# Patient Record
Sex: Female | Born: 1995 | Race: White | Hispanic: Yes | Marital: Single | Smoking: Former smoker
Health system: Southern US, Community
[De-identification: ages and names within clinical notes are randomized; demographics above are authoritative.]

## PROBLEM LIST (undated history)

## (undated) DIAGNOSIS — G43909 Migraine, unspecified, not intractable, without status migrainosus: Secondary | ICD-10-CM

## (undated) DIAGNOSIS — N920 Excessive and frequent menstruation with regular cycle: Secondary | ICD-10-CM

## (undated) DIAGNOSIS — D649 Anemia, unspecified: Secondary | ICD-10-CM

## (undated) DIAGNOSIS — R011 Cardiac murmur, unspecified: Secondary | ICD-10-CM

## (undated) DIAGNOSIS — Z9289 Personal history of other medical treatment: Secondary | ICD-10-CM

## (undated) HISTORY — DX: Anemia, unspecified: D64.9

## (undated) HISTORY — DX: Migraine, unspecified, not intractable, without status migrainosus: G43.909

---
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
## 2015-05-08 ENCOUNTER — Emergency Department
Admission: EM | Admit: 2015-05-08 | Discharge: 2015-05-08 | Disposition: A | Discharge status: Home / Self Care | Payer: Self-pay | Attending: Emergency Medicine | Admitting: Emergency Medicine

## 2015-05-08 ENCOUNTER — Encounter: Payer: Self-pay | Admitting: Emergency Medicine

## 2015-05-08 DIAGNOSIS — R112 Nausea with vomiting, unspecified: Secondary | ICD-10-CM | POA: Insufficient documentation

## 2015-05-08 DIAGNOSIS — R1013 Epigastric pain: Secondary | ICD-10-CM | POA: Insufficient documentation

## 2015-05-08 DIAGNOSIS — Z3202 Encounter for pregnancy test, result negative: Secondary | ICD-10-CM | POA: Insufficient documentation

## 2015-05-08 DIAGNOSIS — R197 Diarrhea, unspecified: Secondary | ICD-10-CM | POA: Insufficient documentation

## 2015-05-08 HISTORY — DX: Cardiac murmur, unspecified: R01.1

## 2015-05-08 LAB — CBC
HCT: 28.4 % — ABNORMAL LOW (ref 35.0–47.0)
Hemoglobin: 8.3 g/dL — ABNORMAL LOW (ref 12.0–16.0)
MCH: 15.8 pg — ABNORMAL LOW (ref 26.0–34.0)
MCHC: 29.3 g/dL — ABNORMAL LOW (ref 32.0–36.0)
MCV: 53.9 fL — ABNORMAL LOW (ref 80.0–100.0)
PLATELETS: 274 10*3/uL (ref 150–440)
RBC: 5.27 MIL/uL — AB (ref 3.80–5.20)
RDW: 19.9 % — ABNORMAL HIGH (ref 11.5–14.5)
WBC: 8.2 10*3/uL (ref 3.6–11.0)

## 2015-05-08 LAB — COMPREHENSIVE METABOLIC PANEL
ALK PHOS: 77 U/L (ref 38–126)
ALT: 16 U/L (ref 14–54)
ANION GAP: 7 (ref 5–15)
AST: 17 U/L (ref 15–41)
Albumin: 4 g/dL (ref 3.5–5.0)
BUN: 17 mg/dL (ref 6–20)
CALCIUM: 8.8 mg/dL — AB (ref 8.9–10.3)
CO2: 23 mmol/L (ref 22–32)
CREATININE: 0.57 mg/dL (ref 0.44–1.00)
Chloride: 106 mmol/L (ref 101–111)
Glucose, Bld: 94 mg/dL (ref 65–99)
Potassium: 3.9 mmol/L (ref 3.5–5.1)
Sodium: 136 mmol/L (ref 135–145)
TOTAL PROTEIN: 7.6 g/dL (ref 6.5–8.1)
Total Bilirubin: 1.3 mg/dL — ABNORMAL HIGH (ref 0.3–1.2)

## 2015-05-08 LAB — POCT PREGNANCY, URINE: PREG TEST UR: NEGATIVE

## 2015-05-08 LAB — LIPASE, BLOOD: Lipase: 18 U/L (ref 11–51)

## 2015-05-08 MED ORDER — ONDANSETRON HCL 4 MG PO TABS: 4.0000 mg | ORAL_TABLET | Freq: Three times a day (TID) | ORAL | Status: DC | PRN

## 2015-05-08 MED ORDER — ONDANSETRON 4 MG PO TBDP: 4.0000 mg | ORAL_TABLET | Freq: Once | ORAL | Status: DC | PRN

## 2015-05-08 NOTE — ED Notes (Signed)
Pt states lower abd pain sharp in nature, states nausea and vomiting and diaherra, pt states she has been able to drink water today, pt in no acute distress

## 2015-05-08 NOTE — ED Notes (Signed)
Pt reports abdominal pain for about 12 hours with N/V/D; started in epigastric area, now across low abd; denies urinary s/s;

## 2015-05-08 NOTE — Discharge Instructions (Signed)
You were evaluated for nausea, vomiting, and diarrhea, and your exam and evaluation are reassuring today in the emergency department. Return to the emergency room for any worsening condition including fever, black or bloody stools, bloody vomiting, weakness, dizziness or passing out, any new or worsening abdominal pain or any other symptoms concerning to you.   Diarrhea Diarrhea is watery poop (stool). It can make you feel weak, tired, thirsty, or give you a dry mouth (signs of dehydration). Watery poop is a sign of another problem, most often an infection. It often lasts 2-3 days. It can last longer if it is a sign of something serious. Take care of yourself as told by your doctor. HOME CARE   Drink 1 cup (8 ounces) of fluid each time you have watery poop.  Do not drink the following fluids:  Those that contain simple sugars (fructose, glucose, galactose, lactose, sucrose, maltose).  Sports drinks.  Fruit juices.  Whole milk products.  Sodas.  Drinks with caffeine (coffee, tea, soda) or alcohol.  Oral rehydration solution may be used if the doctor says it is okay. You may make your own solution. Follow this recipe:   - teaspoon table salt.   teaspoon baking soda.   teaspoon salt substitute containing potassium chloride.  1 tablespoons sugar.  1 liter (34 ounces) of water.  Avoid the following foods:  High fiber foods, such as raw fruits and vegetables.  Nuts, seeds, and whole grain breads and cereals.   Those that are sweetened with sugar alcohols (xylitol, sorbitol, mannitol).  Try eating the following foods:  Starchy foods, such as rice, toast, pasta, low-sugar cereal, oatmeal, baked potatoes, crackers, and bagels.  Bananas.  Applesauce.  Eat probiotic-rich foods, such as yogurt and milk products that are fermented.  Wash your hands well after each time you have watery poop.  Only take medicine as told by your doctor.  Take a warm bath to help lessen  burning or pain from having watery poop. GET HELP RIGHT AWAY IF:   You cannot drink fluids without throwing up (vomiting).  You keep throwing up.  You have blood in your poop, or your poop looks black and tarry.  You do not pee (urinate) in 6-8 hours, or there is only a small amount of very dark pee.  You have belly (abdominal) pain that gets worse or stays in the same spot (localizes).  You are weak, dizzy, confused, or light-headed.  You have a very bad headache.  Your watery poop gets worse or does not get better.  You have a fever or lasting symptoms for more than 2-3 days.  You have a fever and your symptoms suddenly get worse. MAKE SURE YOU:   Understand these instructions.  Will watch your condition.  Will get help right away if you are not doing well or get worse.   This information is not intended to replace advice given to you by your health care provider. Make sure you discuss any questions you have with your health care provider.   Document Released: 11/05/2007 Document Revised: 06/09/2014 Document Reviewed: 01/25/2012 Elsevier Interactive Patient Education 2016 Elsevier Inc.  Nausea and Vomiting Nausea is a sick feeling that often comes before throwing up (vomiting). Vomiting is a reflex where stomach contents come out of your mouth. Vomiting can cause severe loss of body fluids (dehydration). Children and elderly adults can become dehydrated quickly, especially if they also have diarrhea. Nausea and vomiting are symptoms of a condition or disease. It is  important to find the cause of your symptoms. CAUSES   Direct irritation of the stomach lining. This irritation can result from increased acid production (gastroesophageal reflux disease), infection, food poisoning, taking certain medicines (such as nonsteroidal anti-inflammatory drugs), alcohol use, or tobacco use.  Signals from the brain.These signals could be caused by a headache, heat exposure, an inner ear  disturbance, increased pressure in the brain from injury, infection, a tumor, or a concussion, pain, emotional stimulus, or metabolic problems.  An obstruction in the gastrointestinal tract (bowel obstruction).  Illnesses such as diabetes, hepatitis, gallbladder problems, appendicitis, kidney problems, cancer, sepsis, atypical symptoms of a heart attack, or eating disorders.  Medical treatments such as chemotherapy and radiation.  Receiving medicine that makes you sleep (general anesthetic) during surgery. DIAGNOSIS Your caregiver may ask for tests to be done if the problems do not improve after a few days. Tests may also be done if symptoms are severe or if the reason for the nausea and vomiting is not clear. Tests may include:  Urine tests.  Blood tests.  Stool tests.  Cultures (to look for evidence of infection).  X-rays or other imaging studies. Test results can help your caregiver make decisions about treatment or the need for additional tests. TREATMENT You need to stay well hydrated. Drink frequently but in small amounts.You may wish to drink water, sports drinks, clear broth, or eat frozen ice pops or gelatin dessert to help stay hydrated.When you eat, eating slowly may help prevent nausea.There are also some antinausea medicines that may help prevent nausea. HOME CARE INSTRUCTIONS   Take all medicine as directed by your caregiver.  If you do not have an appetite, do not force yourself to eat. However, you must continue to drink fluids.  If you have an appetite, eat a normal diet unless your caregiver tells you differently.  Eat a variety of complex carbohydrates (rice, wheat, potatoes, bread), lean meats, yogurt, fruits, and vegetables.  Avoid high-fat foods because they are more difficult to digest.  Drink enough water and fluids to keep your urine clear or pale yellow.  If you are dehydrated, ask your caregiver for specific rehydration instructions. Signs of  dehydration may include:  Severe thirst.  Dry lips and mouth.  Dizziness.  Dark urine.  Decreasing urine frequency and amount.  Confusion.  Rapid breathing or pulse. SEEK IMMEDIATE MEDICAL CARE IF:   You have blood or brown flecks (like coffee grounds) in your vomit.  You have black or bloody stools.  You have a severe headache or stiff neck.  You are confused.  You have severe abdominal pain.  You have chest pain or trouble breathing.  You do not urinate at least once every 8 hours.  You develop cold or clammy skin.  You continue to vomit for longer than 24 to 48 hours.  You have a fever. MAKE SURE YOU:   Understand these instructions.  Will watch your condition.  Will get help right away if you are not doing well or get worse.   This information is not intended to replace advice given to you by your health care provider. Make sure you discuss any questions you have with your health care provider.   Document Released: 05/19/2005 Document Revised: 08/11/2011 Document Reviewed: 10/16/2010 Elsevier Interactive Patient Education [employer] Elsevier Inc.

## 2015-05-08 NOTE — ED Provider Notes (Signed)
[facility] Emergency Department Provider Note   ____________________________________________  Time seen:  I have reviewed the triage vital signs and the triage nursing note.  HISTORY  Chief Complaint Abdominal Pain; Emesis; and Diarrhea   Historian Patient  HPI Eileen Peterson is a 19 y.o. female who is here for evaluation of nausea, vomiting, and diarrhea which started around 3 AM this morning. Has been intermittent and occurring all day long. She's had some mild abdominal cramping especially in the upper middle area. Symptoms are mild to moderate. No fever but positive for mild chills. No urinary symptoms. No pelvic pain or vaginal discharge. Her coughing or trouble breathing. No exacerbating or alleviating factors.    Past Medical History  Diagnosis Date  . Heart murmur     There are no active problems to display for this patient.   History reviewed. No pertinent past surgical history.  Current Outpatient Rx  Name  Route  Sig  Dispense  Refill  . ondansetron (ZOFRAN) 4 MG tablet   Oral   Take 1 tablet (4 mg total) by mouth every 8 (eight) hours as needed for nausea or vomiting.   10 tablet   0     Allergies Review of patient's allergies indicates no known allergies.  History reviewed. No pertinent family history.  Social History Social History  Substance Use Topics  . Smoking status: Never Smoker   . Smokeless tobacco: None  . Alcohol Use: Yes    Review of Systems  Constitutional: Negative for fever. Eyes: Negative for visual changes. ENT: Negative for sore throat. Cardiovascular: Negative for chest pain. Respiratory: Negative for shortness of breath. Gastrointestinal: No black or bloody stools.. Genitourinary: Negative for dysuria. Musculoskeletal: Negative for back pain. Skin: Negative for rash. Neurological: Negative for headache. 10 point Review of Systems otherwise  negative ____________________________________________   PHYSICAL EXAM:  VITAL SIGNS: ED Triage Vitals  Enc Vitals Group     BP 05/08/15 1458 123/67 mmHg     Pulse Rate 05/08/15 1458 91     Resp 05/08/15 1458 18     Temp 05/08/15 1458 98.2 F (36.8 C)     Temp Source 05/08/15 1458 Oral     SpO2 05/08/15 1458 100 %     Weight 05/08/15 1458 197 lb (89.359 kg)     Height 05/08/15 1458 5' (1.524 m)     Head Cir --      Peak Flow --      Pain Score 05/08/15 1459 6     Pain Loc --      Pain Edu? --      Excl. in GC? --      Constitutional: Alert and oriented. Well appearing and in no distress. Eyes: Conjunctivae are normal. PERRL. Normal extraocular movements. ENT   Head: Normocephalic and atraumatic.   Nose: No congestion/rhinnorhea.   Mouth/Throat: Mucous membranes are moist.   Neck: No stridor. Cardiovascular/Chest: Normal rate, regular rhythm.  No murmurs, rubs, or gallops. Respiratory: Normal respiratory effort without tachypnea nor retractions. Breath sounds are clear and equal bilaterally. No wheezes/rales/rhonchi. Gastrointestinal: Soft. No distention, no guarding, no rebound. Very mild tenderness in the epigastrium. Obese. No lower abdominal tenderness or McBurney's point tenderness. No right upper quadrant tenderness.  Genitourinary/rectal:Deferred Musculoskeletal: Nontender with normal range of motion in all extremities. No joint effusions.  No lower extremity tenderness.  No edema. Neurologic:  Normal speech and language. No gross or focal neurologic deficits are appreciated. Skin:  Skin is warm,  dry and intact. No rash noted. Psychiatric: Mood and affect are normal. Speech and behavior are normal. Patient exhibits appropriate insight and judgment.  ____________________________________________   EKG I, Governor Rooks, MD, the attending physician have personally viewed and interpreted all ECGs.   EKG  performed ____________________________________________  LABS (pertinent positives/negatives)  Urine pregnancy test negative Lipase 18 Comprehensive metabolic panel without significant abnormalities White blood count 8.2, hemoglobin 8.3 and platelet count 274 ____________________________________________  RADIOLOGY All Xrays were viewed by me. Imaging interpreted by Radiologist.  None __________________________________________  PROCEDURES  Procedure(s) performed: None  Critical Care performed: None  ____________________________________________   ED COURSE / ASSESSMENT AND PLAN  CONSULTATIONS: None  Pertinent labs & imaging results that were available during my care of the patient were reviewed by me and considered in my medical decision making (see chart for details).  Patient is here for evaluation of nausea, vomiting, and diarrhea with a stable and reassuring evaluation, exam, and vital signs. I suspect a viral gastroenteritis. Her abdomen is soft and benign. We discussed no indication for emergent imaging at this point time. We did discuss return precautions.  Patient / Family / Caregiver informed of clinical course, medical decision-making process, and agree with plan.   I discussed return precautions, follow-up instructions, and discharged instructions with patient and/or family.  ___________________________________________   FINAL CLINICAL IMPRESSION(S) / ED DIAGNOSES   Final diagnoses:  Nausea vomiting and diarrhea       Governor Rooks, MD 05/08/15 1756
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
## 2015-06-22 DIAGNOSIS — J45909 Unspecified asthma, uncomplicated: Secondary | ICD-10-CM | POA: Insufficient documentation

## 2015-06-22 DIAGNOSIS — E669 Obesity, unspecified: Secondary | ICD-10-CM | POA: Insufficient documentation

## 2015-06-22 DIAGNOSIS — N920 Excessive and frequent menstruation with regular cycle: Secondary | ICD-10-CM

## 2015-06-22 HISTORY — DX: Excessive and frequent menstruation with regular cycle: N92.0

## 2015-06-27 ENCOUNTER — Emergency Department
Admission: EM | Admit: 2015-06-27 | Discharge: 2015-06-27 | Disposition: A | Discharge status: Home / Self Care | Payer: Self-pay | Attending: Emergency Medicine | Admitting: Emergency Medicine

## 2015-06-27 ENCOUNTER — Encounter: Payer: Self-pay | Admitting: Emergency Medicine

## 2015-06-27 DIAGNOSIS — F17200 Nicotine dependence, unspecified, uncomplicated: Secondary | ICD-10-CM | POA: Insufficient documentation

## 2015-06-27 DIAGNOSIS — Z3202 Encounter for pregnancy test, result negative: Secondary | ICD-10-CM | POA: Insufficient documentation

## 2015-06-27 DIAGNOSIS — D649 Anemia, unspecified: Secondary | ICD-10-CM | POA: Insufficient documentation

## 2015-06-27 LAB — COMPREHENSIVE METABOLIC PANEL
ALK PHOS: 77 U/L (ref 38–126)
ALT: 16 U/L (ref 14–54)
ANION GAP: 8 (ref 5–15)
AST: 18 U/L (ref 15–41)
Albumin: 3.9 g/dL (ref 3.5–5.0)
BUN: 9 mg/dL (ref 6–20)
CALCIUM: 8.8 mg/dL — AB (ref 8.9–10.3)
CHLORIDE: 107 mmol/L (ref 101–111)
CO2: 22 mmol/L (ref 22–32)
CREATININE: 0.48 mg/dL (ref 0.44–1.00)
Glucose, Bld: 93 mg/dL (ref 65–99)
Potassium: 3.8 mmol/L (ref 3.5–5.1)
SODIUM: 137 mmol/L (ref 135–145)
Total Bilirubin: 0.9 mg/dL (ref 0.3–1.2)
Total Protein: 7.3 g/dL (ref 6.5–8.1)

## 2015-06-27 LAB — CBC
HCT: 28.1 % — ABNORMAL LOW (ref 35.0–47.0)
HEMOGLOBIN: 7.9 g/dL — AB (ref 12.0–16.0)
MCH: 15.2 pg — ABNORMAL LOW (ref 26.0–34.0)
MCHC: 28.2 g/dL — ABNORMAL LOW (ref 32.0–36.0)
MCV: 54 fL — ABNORMAL LOW (ref 80.0–100.0)
PLATELETS: 241 10*3/uL (ref 150–440)
RBC: 5.2 MIL/uL (ref 3.80–5.20)
RDW: 21.1 % — ABNORMAL HIGH (ref 11.5–14.5)
WBC: 8.3 10*3/uL (ref 3.6–11.0)

## 2015-06-27 LAB — POCT PREGNANCY, URINE: PREG TEST UR: NEGATIVE

## 2015-06-27 LAB — LIPASE, BLOOD: LIPASE: 19 U/L (ref 11–51)

## 2015-06-27 NOTE — ED Provider Notes (Signed)
[facility] Emergency Department Provider Note  ____________________________________________  Time seen: 2:10 PM  I have reviewed the triage vital signs and the nursing notes.   HISTORY  Chief Complaint Abdominal Pain    HPI Eileen Peterson is a 20 y.o. female [facility] due to a hemoglobin of 7.5. The patient has a long history of menorrhagia requiring a blood transfusion about 6 months ago. Since that time she states that her periods have actually become more regular and she bleeds once a month for about 5 days at a time. Her periods have also become lighter and she describes them as fairly normal and not heavy. She's been having some fatigue recently but denies chest pain shortness of breath fevers dizziness or syncope. Last menstrual period was December 26. She was started on oral contraceptives 3 days ago at the health Department.     Past Medical History  Diagnosis Date  . Heart murmur      There are no active problems to display for this patient.    History reviewed. No pertinent past surgical history.   Current Outpatient Rx  Name  Route  Sig  Dispense  Refill  . ondansetron (ZOFRAN) 4 MG tablet   Oral   Take 1 tablet (4 mg total) by mouth every 8 (eight) hours as needed for nausea or vomiting.   10 tablet   0      Allergies Review of patient's allergies indicates no known allergies.   History reviewed. No pertinent family history.  Social History Social History  Substance Use Topics  . Smoking status: Current Every Day Smoker  . Smokeless tobacco: None  . Alcohol Use: Yes    Review of Systems  Constitutional:   No fever or chills. No weight changes Eyes:   No blurry vision or double vision.  ENT:   No sore throat. Cardiovascular:   No chest pain. Respiratory:   No dyspnea or cough. Gastrointestinal:   Negative for abdominal pain, vomiting and diarrhea.  No BRBPR or  melena. Genitourinary:   Negative for dysuria, urinary retention, bloody urine, or difficulty urinating. Musculoskeletal:   Negative for back pain. No joint swelling or pain. Skin:   Negative for rash. Neurological:   Negative for headaches, focal weakness or numbness. Psychiatric:  No anxiety or depression.   Endocrine:  No hot/cold intolerance, positive fatigue, or sleep difficulty.  10-point ROS otherwise negative.  ____________________________________________   PHYSICAL EXAM:  VITAL SIGNS: ED Triage Vitals  Enc Vitals Group     BP 06/27/15 1349 142/74 mmHg     Pulse Rate 06/27/15 1349 97     Resp 06/27/15 1349 20     Temp 06/27/15 1349 98.5 F (36.9 C)     Temp Source 06/27/15 1349 Oral     SpO2 06/27/15 1349 99 %     Weight 06/27/15 1349 205 lb (92.987 kg)     Height 06/27/15 1349 5' (1.524 m)     Head Cir --      Peak Flow --      Pain Score 06/27/15 1349 6     Pain Loc --      Pain Edu? --      Excl. in GC? --     Vital signs reviewed, nursing assessments reviewed.   Constitutional:   Alert and oriented. Well appearing and in no distress. Eyes:   No scleral icterus. Mild conjunctival pallor. PERRL. EOMI ENT  Head:   Normocephalic and atraumatic.   Nose:   No congestion/rhinnorhea. No septal hematoma   Mouth/Throat:   MMM, no pharyngeal erythema. No peritonsillar mass. No uvula shift.   Neck:   No stridor. No SubQ emphysema. No meningismus. Hematological/Lymphatic/Immunilogical:   No cervical lymphadenopathy. Cardiovascular:   RRR. Normal and symmetric distal pulses are present in all extremities. No murmurs, rubs, or gallops. Respiratory:   Normal respiratory effort without tachypnea nor retractions. Breath sounds are clear and equal bilaterally. No wheezes/rales/rhonchi. Gastrointestinal:   Soft and nontender. No distention. There is no CVA tenderness.  No rebound, rigidity, or guarding. Genitourinary:   deferred Musculoskeletal:   Nontender  with normal range of motion in all extremities. No joint effusions.  No lower extremity tenderness.  No edema. Neurologic:   Normal speech and language.  CN 2-10 normal. Motor grossly intact. No pronator drift.  Normal gait. No gross focal neurologic deficits are appreciated.  Skin:    Skin is warm, dry and intact. No rash noted.  No petechiae, purpura, or bullae. Psychiatric:   Mood and affect are normal. Speech and behavior are normal. Patient exhibits appropriate insight and judgment.  ____________________________________________    LABS (pertinent positives/negatives) (all labs ordered are listed, but only abnormal results are displayed) Labs Reviewed  COMPREHENSIVE METABOLIC PANEL - Abnormal; Notable for the following:    Calcium 8.8 (*)    All other components within normal limits  CBC - Abnormal; Notable for the following:    Hemoglobin 7.9 (*)    HCT 28.1 (*)    MCV 54.0 (*)    MCH 15.2 (*)    MCHC 28.2 (*)    RDW 21.1 (*)    All other components within normal limits  LIPASE, BLOOD  POC URINE PREG, ED  POCT PREGNANCY, URINE   ____________________________________________   EKG    ____________________________________________    RADIOLOGY    ____________________________________________   PROCEDURES   ____________________________________________   INITIAL IMPRESSION / ASSESSMENT AND PLAN / ED COURSE  Pertinent labs & imaging results that were available during my care of the patient were reviewed by me and considered in my medical decision making (see chart for details).  Patient presents with fatigue and a concern for anemia. She does not appear to be overly symptomatic at this time, and by her account, her prior menorrhagia has actually improved. She is also on oral contraceptives which is actually the appropriate first line treatment for this. Hemoglobin is 7.9 which is essentially stable from the beginning of December. I discussed the case with  on-call obstetrician who agrees with current plan. We'll give her oral contraceptives a three-month trial, if she has worsening symptoms or failure to improve then he can be reassessed at that time. He does not require any other medications or interventions at this time. Would not transfuse today.     ____________________________________________   FINAL CLINICAL IMPRESSION(S) / ED DIAGNOSES  Final diagnoses:  Anemia, unspecified anemia type      Sharman Cheek, MD 06/27/15 1536

## 2015-06-27 NOTE — ED Notes (Addendum)
Pt to ed with c/o abd pain x 2 weeks reports vomiting x 1 this am, denies diarrhea.  Pt states she was seen at health dept for birth control to regulate periods.

## 2015-06-27 NOTE — ED Notes (Signed)
Patient seen at health department on 1/20 for birth control refill. Labs were drawn and Hgb was 7.3. Patient was told to follow up with PCP. Was not given an appointment until March. Patient complaining of generalized weakness and nausea. Awake, A&O x4. NAD.

## 2015-06-27 NOTE — Discharge Instructions (Signed)
Anemia, Nonspecific Anemia is a condition in which the concentration of red blood cells or hemoglobin in the blood is below normal. Hemoglobin is a substance in red blood cells that carries oxygen to the tissues of the body. Anemia results in not enough oxygen reaching these tissues.  CAUSES  Common causes of anemia include:   Excessive bleeding. Bleeding may be internal or external. This includes excessive bleeding from periods (in women) or from the intestine.   Poor nutrition.   Chronic kidney, thyroid, and liver disease.  Bone marrow disorders that decrease red blood cell production.  Cancer and treatments for cancer.  HIV, AIDS, and their treatments.  Spleen problems that increase red blood cell destruction.  Blood disorders.  Excess destruction of red blood cells due to infection, medicines, and autoimmune disorders. SIGNS AND SYMPTOMS   Minor weakness.   Dizziness.   Headache.  Palpitations.   Shortness of breath, especially with exercise.   Paleness.  Cold sensitivity.  Indigestion.  Nausea.  Difficulty sleeping.  Difficulty concentrating. Symptoms may occur suddenly or they may develop slowly.  DIAGNOSIS  Additional blood tests are often needed. These help your health care provider determine the best treatment. Your health care provider will check your stool for blood and look for other causes of blood loss.  TREATMENT  Treatment varies depending on the cause of the anemia. Treatment can include:   Supplements of iron, vitamin B12, or folic acid.   Hormone medicines.   A blood transfusion. This may be needed if blood loss is severe.   Hospitalization. This may be needed if there is significant continual blood loss.   Dietary changes.  Spleen removal. HOME CARE INSTRUCTIONS Keep all follow-up appointments. It often takes many weeks to correct anemia, and having your health care provider check on your condition and your response to  treatment is very important. SEEK IMMEDIATE MEDICAL CARE IF:   You develop extreme weakness, shortness of breath, or chest pain.   You become dizzy or have trouble concentrating.  You develop heavy vaginal bleeding.   You develop a rash.   You have bloody or black, tarry stools.   You faint.   You vomit up blood.   You vomit repeatedly.   You have abdominal pain.  You have a fever or persistent symptoms for more than 2-3 days.   You have a fever and your symptoms suddenly get worse.   You are dehydrated.  MAKE SURE YOU:  Understand these instructions.  Will watch your condition.  Will get help right away if you are not doing well or get worse.   This information is not intended to replace advice given to you by your health care provider. Make sure you discuss any questions you have with your health care provider.   Document Released: 06/26/2004 Document Revised: 01/19/2013 Document Reviewed: 11/12/2012 Elsevier Interactive Patient Education 2016 [employer]  

## 2015-12-19 ENCOUNTER — Emergency Department
Admission: EM | Admit: 2015-12-19 | Discharge: 2015-12-19 | Disposition: A | Discharge status: Home / Self Care | Payer: Self-pay | Attending: Emergency Medicine | Admitting: Emergency Medicine

## 2015-12-19 DIAGNOSIS — B2790 Infectious mononucleosis, unspecified without complication: Secondary | ICD-10-CM | POA: Insufficient documentation

## 2015-12-19 DIAGNOSIS — F17200 Nicotine dependence, unspecified, uncomplicated: Secondary | ICD-10-CM | POA: Insufficient documentation

## 2015-12-19 LAB — POCT RAPID STREP A: STREPTOCOCCUS, GROUP A SCREEN (DIRECT): NEGATIVE

## 2015-12-19 LAB — MONONUCLEOSIS SCREEN: MONO SCREEN: POSITIVE — AB

## 2015-12-19 NOTE — ED Notes (Signed)
Pt c/o sore throat since Monday, c/o "white bumps" on tonsils this am. Pt in NAD at this time

## 2015-12-19 NOTE — ED Notes (Signed)
Pt here with c/o sore throat x3 days.

## 2015-12-19 NOTE — Discharge Instructions (Signed)
Infectious Mononucleosis °Infectious mononucleosis is an infection caused by a virus. This illness is often called "mono." It causes symptoms that affect various areas of the body, including the throat, upper air passages, and lymph glands. The liver or spleen may also be affected. °The virus spreads from person to person through close contact. The illness is usually not serious and often goes away in 2-4 weeks without treatment. In rare cases, symptoms can be more severe and last longer, sometimes up to several months. Because the illness can sometimes cause the liver or spleen to become enlarged, you should not participate in contact sports or strenuous exercise until your health care provider approves. °CAUSES  °Infectious mononucleosis is caused by the Epstein-Barr virus. This virus spreads through contact with an infected person's saliva or other bodily fluids. It is often spread through kissing. It may also spread through coughing or sharing utensils or drinking glasses that were recently used by an infected person. An infected person will not always appear ill but can still spread the virus. °RISK FACTORS °This illness is most common in adolescents and young adults. °SIGNS AND SYMPTOMS  °The most common symptoms of infectious mononucleosis are: °· Sore throat.   °· Headache.   °· Fatigue.   °· Muscle aches.   °· Swollen glands.   °· Fever.   °· Poor appetite.   °· Enlarged liver or spleen.   °Some less common symptoms that can also occur include: °· Rash. This is more common if you take antibiotic medicines. °· Feeling sick to your stomach (nauseous).   °· Abdominal pain.   °DIAGNOSIS  °Your health care provider will take your medical history and do a physical exam. Blood tests can be done to confirm the diagnosis.  °TREATMENT  °Infectious mononucleosis usually goes away on its own with time. It cannot be cured with medicines, but medicines are sometimes used to relieve symptoms. Steroid medicine is sometimes  needed if the swelling in the throat causes breathing or swallowing problems. Treatment in a hospital is sometimes needed for severe cases.  °HOME CARE INSTRUCTIONS  °· Rest as needed.   °· Do not participate in contact sports, strenuous exercise, or heavy lifting until your health care provider approves. The liver and spleen could be seriously injured if they are enlarged from the illness. You may need to wait a couple months before participating in sports.   °· Drink enough fluid to keep your urine clear or pale yellow.   °· Do not drink alcohol. °· Take medicines only as directed by your health care provider. Children under 18 years of age should not take aspirin because of the association with Reye syndrome.   °· Eat soft foods. Cold foods such as ice cream or frozen ice pops can soothe a sore throat. °· If you have a sore throat, gargle with a mixture of salt and water. This may help relieve your discomfort. Mix 1 tsp of salt in 1 cup of warm water. Sucking on hard candy may also help.   °· Start regular activities gradually after the fever is gone. Be sure to rest when tired.   °· Avoid kissing or sharing utensils or drinking glasses until your health care provider tells you that you are no longer contagious.   °PREVENTION  °To avoid spreading the virus, do not kiss anyone or share utensils, drinking glasses, or food until your health care provider tells you that you are no longer contagious. °SEEK MEDICAL CARE IF:  °· Your fever is not gone after 10 days. °· You have swollen lymph nodes that are not   back to normal after 4 weeks. °· Your activity level is not back to normal after 2 months.   °· You have yellow coloring to your eyes and skin (jaundice). °· You have constipation.   °SEEK IMMEDIATE MEDICAL CARE IF:  °· You have severe pain in the abdomen or shoulder. °· You are drooling. °· You have trouble swallowing. °· You have trouble breathing. °· You develop a stiff neck. °· You develop a severe  headache. °· You cannot stop throwing up (vomiting). °· You have convulsions. °· You are confused. °· You have trouble with balance. °· You have signs of dehydration. These may include: °¨ Weakness. °¨ Sunken eyes. °¨ Pale skin. °¨ Dry mouth. °¨ Rapid breathing or pulse. °  °This information is not intended to replace advice given to you by your health care provider. Make sure you discuss any questions you have with your health care provider. °  °Document Released: 05/16/2000 Document Revised: 06/09/2014 Document Reviewed: 01/24/2014 °Elsevier Interactive Patient Education ©2016 Elsevier Inc. ° °

## 2015-12-19 NOTE — ED Provider Notes (Signed)
[facility] Emergency Department Provider Note  ____________________________________________  Time seen: Approximately 3:33 PM  I have reviewed the triage vital signs and the nursing notes.   HISTORY  Chief Complaint Sore Throat    HPI Eileen Peterson is a 20 y.o. female, NAD, presents to the emergency department with 3 day history of sore throat. Has had a headache and mild ear pressure but no other symptoms. Has been taking Excedrin with some relief. Last dose of Excedrin was at 8am today. Sore throat persists and has been worsening. Denies fever, body aches, nasal congestion, sinus pain/pressure, ear pain, nausea, vomiting. Did have one episode of abdominal pain, chills yesterday that was alleviated with a loose bowel movement. Has had no further episodes of abdominal pain nor diarrhea.  Was exposed to a family member who has had a viral illness over the last week.    Past Medical History  Diagnosis Date  . Heart murmur     There are no active problems to display for this patient.   No past surgical history on file.  Current Outpatient Rx  Name  Route  Sig  Dispense  Refill  . ondansetron (ZOFRAN) 4 MG tablet   Oral   Take 1 tablet (4 mg total) by mouth every 8 (eight) hours as needed for nausea or vomiting.   10 tablet   0     Allergies Review of patient's allergies indicates no known allergies.  No family history on file.  Social History Social History  Substance Use Topics  . Smoking status: Current Every Day Smoker  . Smokeless tobacco: Not on file  . Alcohol Use: Yes     Review of Systems Constitutional: Positive chills. No fever, fatigue Eyes: No visual changes.  ENT: Positive sore throat, ear pressure. No nasal congestion, sinus pressure, nasal drainaige Cardiovascular: No chest pain. Respiratory: No cough. No shortness of breath. No wheezing.  Gastrointestinal: One episode abdominal pain alleviated by 1 loose BM.  No  nausea, vomiting.   Musculoskeletal: Negative for general myalgias.  Skin: Negative for rash. Neurological: Positive headaches, but no focal weakness or numbness. 10-point ROS otherwise negative.  ____________________________________________   PHYSICAL EXAM:  VITAL SIGNS: ED Triage Vitals  Enc Vitals Group     BP 12/19/15 1501 124/63 mmHg     Pulse Rate 12/19/15 1501 71     Resp 12/19/15 1501 16     Temp 12/19/15 1501 97.7 F (36.5 C)     Temp Source 12/19/15 1501 Oral     SpO2 12/19/15 1501 97 %     Weight 12/19/15 1501 196 lb (88.905 kg)     Height 12/19/15 1501 5' (1.524 m)     Head Cir --      Peak Flow --      Pain Score 12/19/15 1500 8     Pain Loc --      Pain Edu? --      Excl. in GC? --     Constitutional: Alert and oriented. Well appearing and in no acute distress. Eyes: Conjunctivae are normal.  Head: Atraumatic. ENT:      Ears: TMs visualized bilaterally with trace serous effusion but no bulging, erythema, perforation.       Nose: No congestion/rhinnorhea.      Mouth/Throat: Mucous membranes are moist. Bilateral tonsils with moderate swelling and trace white exudate but no erythema. Posterior pharynx without erythema, swelling, exudate. Neck: No stridor.  No carotid bruits. Supple with full range  of motion. Hematological/Lymphatic/Immunilogical: No cervical lymphadenopathy. Cardiovascular: Normal rate, regular rhythm. Normal S1 and S2. No murmurs, rubs, gallops. Good peripheral circulation. Respiratory: Normal respiratory effort without tachypnea or retractions. Lungs CTAB with breath sounds noted in all lung fields. Neurologic:  Normal speech and language. No gross focal neurologic deficits are appreciated.  Skin:  Skin is warm, dry and intact. No rash noted. Psychiatric: Mood and affect are normal. Speech and behavior are normal. Patient exhibits appropriate insight and judgement.   ____________________________________________   LABS (all labs ordered  are listed, but only abnormal results are displayed)  Labs Reviewed  MONONUCLEOSIS SCREEN - Abnormal; Notable for the following:    Mono Screen POSITIVE (*)    All other components within normal limits  CULTURE, GROUP A STREP [facility]  POCT RAPID STREP A   ____________________________________________  EKG  None ____________________________________________  RADIOLOGY  None ____________________________________________    PROCEDURES  Procedure(s) performed: None   Medications - No data to display   ____________________________________________   INITIAL IMPRESSION / ASSESSMENT AND PLAN / ED COURSE  Pertinent lab results that were available during my care of the patient were reviewed by me and considered in my medical decision making (see chart for details).  Patient's diagnosis is consistent with infectious mononucleosis without complication. Patient will be discharged home with instructions for home care which will include taking over-the-counter Tylenol or ibuprofen as needed as well as utilizing warm salt water gargles for throat pain. [facility] symptoms persist past this treatment course. Patient is given ED precautions to return to the ED for any worsening or new symptoms.    ____________________________________________  FINAL CLINICAL IMPRESSION(S) / ED DIAGNOSES  Final diagnoses:  Infectious mononucleosis without complication, infectious mononucleosis due to unspecified organism      NEW MEDICATIONS STARTED DURING THIS VISIT:  New Prescriptions   No medications on file         Hope PigeonJami L Hagler, PA-C 12/19/15 1634  Phineas SemenGraydon Goodman, MD 12/19/15 [phone]

## 2015-12-22 LAB — CULTURE, GROUP A STREP (THRC)

## 2017-09-14 LAB — HM HIV SCREENING LAB: HM HIV Screening: NEGATIVE

## 2018-05-17 ENCOUNTER — Encounter: Payer: Self-pay | Admitting: Emergency Medicine

## 2018-05-17 ENCOUNTER — Other Ambulatory Visit: Payer: Self-pay

## 2018-05-17 DIAGNOSIS — L03011 Cellulitis of right finger: Secondary | ICD-10-CM | POA: Insufficient documentation

## 2018-05-17 DIAGNOSIS — F17200 Nicotine dependence, unspecified, uncomplicated: Secondary | ICD-10-CM | POA: Insufficient documentation

## 2018-05-17 NOTE — ED Triage Notes (Signed)
Patient ambulatory to triage with steady gait, without difficulty or distress noted; pt reports right 4th nailbed red/swollen since yesterday; st chewed her nail off

## 2018-05-18 ENCOUNTER — Emergency Department
Admission: EM | Admit: 2018-05-18 | Discharge: 2018-05-18 | Disposition: A | Discharge status: Home / Self Care | Payer: Self-pay | Attending: Emergency Medicine | Admitting: Emergency Medicine

## 2018-05-18 DIAGNOSIS — L03011 Cellulitis of right finger: Secondary | ICD-10-CM

## 2018-05-18 MED ORDER — BUPIVACAINE HCL (PF) 0.5 % IJ SOLN: 30.0000 mL | Freq: Once | INTRAMUSCULAR | Status: DC

## 2018-05-18 NOTE — ED Provider Notes (Signed)
[facility] Emergency Department Provider Note  ____________________________________________   None    (approximate)  I have reviewed the triage vital signs and the nursing notes.   HISTORY  Chief Complaint Nail Problem    HPI Eileen Peterson is a 22 y.o. female who self presents to the emergency department with roughly 24 hours of insidious onset slowly progressive now moderate severity pain to her right ring finger.  She chews on her nails and has noted increasing pain and swelling for the past 24 hours.  Denies any particular trauma.  No fevers or chills.  No numbness or weakness.    Past Medical History:  Diagnosis Date  . Heart murmur     There are no active problems to display for this patient.   History reviewed. No pertinent surgical history.  Prior to Admission medications   Medication Sig Start Date End Date Taking? Authorizing Provider  ondansetron (ZOFRAN) 4 MG tablet Take 1 tablet (4 mg total) by mouth every 8 (eight) hours as needed for nausea or vomiting. 05/08/15   Governor RooksLord, Rebecca, MD    Allergies Patient has no known allergies.  No family history on file.  Social History Social History   Tobacco Use  . Smoking status: Current Every Day Smoker  . Smokeless tobacco: Never Used  Substance Use Topics  . Alcohol use: Yes  . Drug use: No    Review of Systems Constitutional: No fever/chills Cardiovascular: Denies chest pain. Respiratory: Denies shortness of breath. Musculoskeletal: Positive for finger pain Neurological: Negative for headaches   ____________________________________________   PHYSICAL EXAM:  VITAL SIGNS: ED Triage Vitals  Enc Vitals Group     BP 05/17/18 2327 (!) 142/89     Pulse Rate 05/17/18 2327 91     Resp 05/17/18 2327 17     Temp 05/17/18 2327 98.2 F (36.8 C)     Temp Source 05/17/18 2327 Oral     SpO2 05/17/18 2327 100 %     Weight 05/17/18 2324 198 lb (89.8 kg)     Height 05/17/18  2324 5' (1.524 m)     Head Circumference --      Peak Flow --      Pain Score 05/17/18 2323 8     Pain Loc --      Pain Edu? --      Excl. in GC? --     Constitutional: Alert and oriented x4 somewhat uncomfortable appearing although nontoxic no diaphoresis and speaks in full clear sentences  Cardiovascular: Regular rate and rhythm Respiratory: Normal respiratory effort.  No retractions. MSK: Radial aspect of right ring finger with paronychia on the radial aspect of the nail.  Neurovascularly intact Neurologic:  Normal speech and language. No gross focal neurologic deficits are appreciated.  Skin: Paronychia as above    ____________________________________________  LABS (all labs ordered are listed, but only abnormal results are displayed)  Labs Reviewed - No data to display   __________________________________________  EKG   ____________________________________________  RADIOLOGY   ____________________________________________   DIFFERENTIAL includes but not limited to  Paronychia, felon, cellulitis, flexor tenosynovitis   PROCEDURES  Procedure(s) performed: Yes  .Marland Kitchen.Incision and Drainage Date/Time: 05/18/2018 1:15 AM Performed by: Merrily Brittleifenbark, Kamarie Palma, MD Authorized by: Merrily Brittleifenbark, Bulmaro Feagans, MD   Consent:    Consent obtained:  Verbal   Consent given by:  Patient   Risks discussed:  Bleeding, incomplete drainage, infection and pain   Alternatives discussed:  Alternative treatment Location:    Indications  for incision and drainage: Paronychia.   Size:  2 cm   Location: Right ring finger. Procedure details:    Incision type: Iris scissors lifting the eponychia.   Drainage amount:  Moderate   Wound treatment:  Wound left open   Packing materials:  None Comments:     I had the patient soak her right ring finger in warm water for 15 to 20 minutes.  I then used an open iris scissor to gently lift her eponychial him and express a moderate amount of purulent material.   No cuts to the skin were required.    Critical Care performed: no  ____________________________________________   INITIAL IMPRESSION / ASSESSMENT AND PLAN / ED COURSE  Pertinent labs & imaging results that were available during my care of the patient were reviewed by me and considered in my medical decision making (see chart for details).   As part of my medical decision making, I reviewed the following data within the electronic MEDICAL RECORD NUMBER History obtained from family if available, nursing notes, old chart and ekg, as well as notes from prior ED visits.  Patient comes to the emergency department with a paronychia with purulence.  Soaked her finger for 15 minutes then lifted the eponychial him with iris scissors expressing the purulent material and her pain was improved thereafter.  No indication for antibiotics.  We will continue symptomatic treatment and primary care follow-up for wound check.      ____________________________________________   FINAL CLINICAL IMPRESSION(S) / ED DIAGNOSES  Final diagnoses:  Paronychia of finger of right hand      NEW MEDICATIONS STARTED DURING THIS VISIT:  Discharge Medication List as of 05/18/2018  1:14 AM       Note:  This document was prepared using Dragon voice recognition software and may include unintentional dictation errors.     Merrily Brittle, MD 05/23/18 2243

## 2018-05-18 NOTE — ED Notes (Signed)
Pt verbalized understanding of d/c instructions and f/u care. No further questions at this time. Ambulatory to exit with steady gait.

## 2018-05-18 NOTE — ED Notes (Signed)
Provider at bedside for bedside procedure 

## 2018-05-18 NOTE — Discharge Instructions (Signed)
Please keep your finger clean and dry and follow up with primary care as needed.  Return to the ED for any concerns.  It was a pleasure to take care of you today, and thank you for coming to our emergency department.  If you have any questions or concerns before leaving please ask the nurse to grab me and I'm more than happy to go through your aftercare instructions again.  If you have any concerns once you are home that you are not improving or are in fact getting worse before you can make it to your follow-up appointment, please do not hesitate to call 911 and come back for further evaluation.  Merrily BrittleNeil Jazel Nimmons, MD

## 2018-12-20 DIAGNOSIS — E669 Obesity, unspecified: Secondary | ICD-10-CM

## 2018-12-22 ENCOUNTER — Other Ambulatory Visit: Payer: Self-pay

## 2018-12-22 ENCOUNTER — Ambulatory Visit (LOCAL_COMMUNITY_HEALTH_CENTER): Payer: Self-pay

## 2018-12-22 VITALS — BP 113/78 | Ht 60.34 in | Wt 197.5 lb

## 2018-12-22 DIAGNOSIS — Z30011 Encounter for initial prescription of contraceptive pills: Secondary | ICD-10-CM

## 2018-12-22 DIAGNOSIS — Z3009 Encounter for other general counseling and advice on contraception: Secondary | ICD-10-CM

## 2018-12-22 MED ORDER — MULTI-VITAMIN/MINERALS PO TABS: 1.0000 | ORAL_TABLET | Freq: Every day | ORAL | 0 refills | Status: AC

## 2018-12-22 MED ORDER — NORGESTIM-ETH ESTRAD TRIPHASIC 0.18/0.215/0.25 MG-35 MCG PO TABS: 1.0000 | ORAL_TABLET | Freq: Every day | ORAL | 0 refills | Status: DC

## 2018-12-22 NOTE — Patient Instructions (Signed)
Client counseled to call for clinic appt at beginning of third week of last pill pack to schedule appt for physical (if resumed) or pill refill through Nurse Clinic. Shona Needles, RN

## 2018-12-22 NOTE — Progress Notes (Signed)
Presents to Nurse Clinic for ocp refill. Last ocp taken was 12/05/2018 with menses  7/3 - 12/08/2018 and 7/13 - 12/20/2018. Last intercourse was end of June 2020. Consult with Antoine Primas regarding above. Per her verbal order: 1) may dispense Tri Sprintec #3 to take one pill QD at same time QD (may begin today or wait til onset of next menses - plans to start today), 2) condom use during first 7 days when pills resumed. Above orders reviewed with client with stated understanding. Folic acid counseling completed.  Shona Needles, RN

## 2019-02-14 ENCOUNTER — Other Ambulatory Visit: Payer: Self-pay

## 2019-02-14 ENCOUNTER — Ambulatory Visit (LOCAL_COMMUNITY_HEALTH_CENTER): Payer: Self-pay

## 2019-02-14 VITALS — BP 116/69 | Ht 60.34 in | Wt 193.5 lb

## 2019-02-14 DIAGNOSIS — Z30011 Encounter for initial prescription of contraceptive pills: Secondary | ICD-10-CM

## 2019-02-14 DIAGNOSIS — Z3009 Encounter for other general counseling and advice on contraception: Secondary | ICD-10-CM

## 2019-02-14 MED ORDER — NORGESTIM-ETH ESTRAD TRIPHASIC 0.18/0.215/0.25 MG-35 MCG PO TABS: 1.0000 | ORAL_TABLET | Freq: Every day | ORAL | 0 refills | Status: DC

## 2019-02-14 MED ORDER — NORGESTIM-ETH ESTRAD TRIPHASIC 0.18/0.215/0.25 MG-35 MCG PO TABS: 1.0000 | ORAL_TABLET | Freq: Every day | ORAL | 11 refills | Status: DC

## 2019-02-14 NOTE — Progress Notes (Signed)
Presents for additional ocps as reports pon week 3 of last pill pack. Consult with Antoine Primas PA as client past due for physical and received following verbal orders: 1) TriSprintec 28 d #3 and take one po QD at same time each day and 2) at beginning of last pack of ocps call for physical appt and if not yet doing physicals, ask for appt with provider. Above explained to client with stated understanding.MVI counseling completed. Client has nearly full bottle of MVI at home and encouraged to take one QD on regular basis. Rich Number, RN

## 2019-04-25 ENCOUNTER — Ambulatory Visit: Payer: Self-pay

## 2019-05-02 ENCOUNTER — Ambulatory Visit: Payer: Self-pay

## 2019-05-04 ENCOUNTER — Encounter: Payer: Self-pay | Admitting: Family Medicine

## 2019-05-04 ENCOUNTER — Other Ambulatory Visit: Payer: Self-pay

## 2019-05-04 ENCOUNTER — Ambulatory Visit (LOCAL_COMMUNITY_HEALTH_CENTER): Payer: Self-pay | Admitting: Family Medicine

## 2019-05-04 VITALS — BP 129/79 | Ht 61.0 in | Wt 201.0 lb

## 2019-05-04 DIAGNOSIS — Z3041 Encounter for surveillance of contraceptive pills: Secondary | ICD-10-CM

## 2019-05-04 DIAGNOSIS — Z3009 Encounter for other general counseling and advice on contraception: Secondary | ICD-10-CM

## 2019-05-04 MED ORDER — NORGESTIM-ETH ESTRAD TRIPHASIC 0.18/0.215/0.25 MG-35 MCG PO TABS: 1.0000 | ORAL_TABLET | Freq: Every day | ORAL | 0 refills | Status: DC

## 2019-05-04 NOTE — Progress Notes (Signed)
   [place] problem visit  [place] Department  Subjective:  Eileen Peterson is a 23 y.o. being seen today for   Chief Complaint  Patient presents with  . Contraception    HPI  Client needs refill on OCP on Trinessa. She voices no concerns.   Does the patient have a current or past history of drug use? No   No components found for: HCV]   Health Maintenance Due  Topic Date Due  . TETANUS/TDAP  09/03/2014  . PAP-Cervical Cytology Screening  09/02/2016  . PAP SMEAR-Modifier  09/02/2016  . INFLUENZA VACCINE  01/01/2019    ROS  The following portions of the patient's history were reviewed and updated as appropriate: allergies, current medications, past family history, past medical history, past social history, past surgical history and problem list. Problem list updated.   See flowsheet for other program required questions.  Objective:   Vitals:   05/04/19 1048  BP: 129/79  Weight: 201 lb (91.2 kg)  Height: 5\' 1"  (1.549 m)    Physical Exam not indicated  Assessment and Plan:  Eileen Peterson is a 23 y.o. female presenting to the [facility] Department for a Women's Health problem visit  1. General counseling and advice on contraceptive management   2. Surveillance of previously prescribed contraceptive pill  - Norgestimate-Ethinyl Estradiol Triphasic 0.18/0.215/0.25 MG-35 MCG tablet; Take 1 tablet by mouth daily.  Dispense: 2 Package; Refill: 0 Co client to make her Annual appt I/2021 for PE and OCP RF. No follow-ups on file.  No future appointments.  Hassell Done, FNP

## 2019-05-04 NOTE — Progress Notes (Signed)
Pt received Tri Sprintec #2 packs today per provider order. Counseled pt per provider orders and pt states understanding. Provider orders completed.Ronny Bacon, RN

## 2019-05-04 NOTE — Progress Notes (Signed)
Patient here for more TriSprintec today. Given 3 packs in 02/2019 at nurse visit. Aileen Fass, RN

## 2019-06-27 ENCOUNTER — Other Ambulatory Visit: Payer: Self-pay

## 2019-06-27 ENCOUNTER — Encounter: Payer: Self-pay | Admitting: Family Medicine

## 2019-06-27 ENCOUNTER — Ambulatory Visit: Payer: Self-pay | Admitting: Family Medicine

## 2019-06-27 VITALS — BP 114/67 | Ht 60.0 in | Wt 207.0 lb

## 2019-06-27 DIAGNOSIS — Z113 Encounter for screening for infections with a predominantly sexual mode of transmission: Secondary | ICD-10-CM

## 2019-06-27 DIAGNOSIS — Z3009 Encounter for other general counseling and advice on contraception: Secondary | ICD-10-CM

## 2019-06-27 LAB — WET PREP FOR TRICH, YEAST, CLUE
Trichomonas Exam: NEGATIVE
Yeast Exam: NEGATIVE

## 2019-06-27 MED ORDER — NORGESTIM-ETH ESTRAD TRIPHASIC 0.18/0.215/0.25 MG-35 MCG PO TABS: 1.0000 | ORAL_TABLET | Freq: Every day | ORAL | 0 refills | Status: DC

## 2019-06-27 NOTE — Progress Notes (Signed)
Patient given 5 packs OTC today, counseled to call when she starts pack #5 to come in for 8 more packs per provider orders. 5 packs given today due to expiration dates 11/30/2019. Wet mount reviewed, no treatment indicated.Burt Knack, RN

## 2019-06-27 NOTE — Progress Notes (Signed)
Family Planning Visit  Subjective:  Eileen Peterson is a 24 y.o. being seen today for  Chief Complaint  Patient presents with  . Contraception    OC    Pt has Asthma and Obesity, unspecified on their problem list.  HPI  Patient reports she is here for OCP refill and physical. Has been taking OCP x4-5 yrs. Just started her last pack, has missed no pills.  Pt does not meet any of the following contraindications to estrogen use: -Age ?35 years and smoking ?15 cigarettes per day -Migraine with aura -Two or more RF for arterial CVD (such as older age, smoking, diabetes, and hypertension) -HTN -Breast cancer -VTE hx or acute event -Known thrombogenic mutations -Known ischemic heart disease -History of stroke -Complicated valvular heart disease (pulmonary HTN, risk for afib, hx subacute bacterial endocarditis) -Cirrhosis, Hepatocellular adenoma or malignant hepatoma    Patient's last menstrual period was 05/31/2019 (exact date).  Last pap: never per [facility] records  Patient reports 1 partner(s) in last year. Do they desire STI screening (if no, why not)? yes  Does the patient desire a pregnancy in the next year? no   24 y.o., Body mass index is 40.43 kg/m. - Is patient eligible for HA1C diabetes screening based on BMI and age >77?  no  Does the patient have a current or past history of drug use? no No components found for: HCV  See flowsheet for other program required questions.   Health Maintenance Due  Topic Date Due  . TETANUS/TDAP  09/03/2014  . PAP-Cervical Cytology Screening  09/02/2016  . PAP SMEAR-Modifier  09/02/2016  . INFLUENZA VACCINE  01/01/2019    ROS  The following portions of the patient's history were reviewed and updated as appropriate: allergies, current medications, past family history, past medical history, past social history, past surgical history and problem list. Problem list updated.  Objective:  BP 114/67   Ht 5' (1.524 m)   Wt 207  lb (93.9 kg)   LMP 05/31/2019 (Exact Date)   BMI 40.43 kg/m    Physical Exam  Vitals and nursing note reviewed.  Constitutional:      Appearance: Normal appearance.  HENT:     Head: Normocephalic and atraumatic.     Mouth/Throat:     Mouth: Mucous membranes are moist.     Pharynx: Oropharynx is clear. No oropharyngeal exudate or posterior oropharyngeal erythema.  Eyes:     Conjunctiva/sclera: Conjunctivae normal.  Neck:     Thyroid: No thyroid mass, thyromegaly or thyroid tenderness.  Cardiovascular:     Rate and Rhythm: Normal rate and regular rhythm.     Pulses: Normal pulses.     Heart sounds: Normal heart sounds.  Pulmonary:     Effort: Pulmonary effort is normal.     Breath sounds: Normal breath sounds.  Chest:     Breasts:        Right: Normal. No swelling, mass, nipple discharge, skin change or tenderness. +inverted nipple       Left: Normal. No swelling, mass, nipple discharge, skin change or tenderness. +inverted nipple. Abdominal:     General: Abdomen is flat.     Palpations: There is no mass.     Tenderness: There is no abdominal tenderness. There is no rebound.  Genitourinary:     General: Normal vulva.     Exam position: Lithotomy position.     Pubic Area: No rash or pubic lice.      Labia:  Right: No rash or lesion.        Left: No rash or lesion.      Vagina: Normal. No vaginal discharge, erythema, bleeding or lesions.     Cervix: No cervical motion tenderness, discharge, friability, lesion or erythema.     Uterus: Normal.      Adnexa: Right adnexa normal and left adnexa normal.     Rectum: Normal.  Lymphadenopathy:     Head:     Right side of head: No preauricular or posterior auricular adenopathy.     Left side of head: No preauricular or posterior auricular adenopathy.     Cervical: No cervical adenopathy.     Upper Body:     Right upper body: No supraclavicular or axillary adenopathy.     Left upper body: No supraclavicular or axillary  adenopathy.     Lower Body: No right inguinal adenopathy. No left inguinal adenopathy.  Skin:    General: Skin is warm and dry.     Findings: No rash.  Neurological:     Mental Status: She is alert and oriented to person, place, and time.      Assessment and Plan:  Eileen Peterson is a 24 y.o. female presenting to the [facility] Department for a well woman exam/family planning visit  Contraception counseling: Reviewed all forms of birth control options in the tiered based approach including abstinence; over the counter/barrier methods; hormonal contraceptive medication including pill, patch, ring, injection, contraceptive implant; hormonal and nonhormonal IUDs; permanent sterilization options including vasectomy and the various tubal sterilization modalities. Risks, benefits, how to discontinue and typical effectiveness rates were reviewed.  Questions were answered.  Written information was also given to the patient to review.  Patient desires OCP, this was prescribed for patient. She will follow up in  1 year for surveillance.  She was told to call with any further questions, or with any concerns about this method of contraception.  Emphasized use of condoms 100% of the time for STI prevention.  Emergency Contraception: n/a  1. Family planning services -Pap and CBE done today.  -Rx OCP x13 packs. Counseling as above.  - Pap IG (Image Guided) - Norgestimate-Ethinyl Estradiol Triphasic (ORTHO TRI-CYCLEN, 28,) 0.18/0.215/0.25 MG-35 MCG tablet; Take 1 tablet by mouth daily.  Dispense: 13 Package; Refill: 0  2. Screening examination for venereal disease -Screenings today as below. Treat wet prep per standing order. -Patient does not meet criteria for HepB, HepC Screening.  -Counseled on warning s/sx and when to seek care. Recommended condom use with all sex and discussed importance of condom use for STI prevention. - WET PREP FOR [place], YEAST, [place] [facility] - Syphilis Serology, [place] [facility]    Return in about 1 year (around 06/26/2020) for yearly wellness exam.  No future appointments.  Kandee Keen, PA-C

## 2019-06-27 NOTE — Progress Notes (Signed)
Here for OC. Last PE 08/2017. Started last week of current pack and needs more. Consents signed in 02/2019 and 05/04/2019.Marland KitchenBurt Knack, RN

## 2019-06-28 LAB — PAP IG (IMAGE GUIDED): PAP Smear Comment: 0

## 2019-11-18 ENCOUNTER — Ambulatory Visit (LOCAL_COMMUNITY_HEALTH_CENTER): Payer: Self-pay | Admitting: Family Medicine

## 2019-11-18 ENCOUNTER — Other Ambulatory Visit: Payer: Self-pay

## 2019-11-18 ENCOUNTER — Encounter: Payer: Self-pay | Admitting: Family Medicine

## 2019-11-18 VITALS — BP 118/73 | Ht 61.0 in | Wt 210.0 lb

## 2019-11-18 DIAGNOSIS — Z3009 Encounter for other general counseling and advice on contraception: Secondary | ICD-10-CM

## 2019-11-18 MED ORDER — NORETHINDRONE 0.35 MG PO TABS: 1.0000 | ORAL_TABLET | Freq: Every day | ORAL | 8 refills | Status: DC

## 2019-11-18 NOTE — Progress Notes (Signed)
I did not see pt. Please see RN documentation. 

## 2019-11-18 NOTE — Addendum Note (Signed)
Addended by: Tawny Hopping A on: 11/18/2019 05:24 PM   Modules accepted: Orders

## 2019-11-18 NOTE — Progress Notes (Signed)
Here today for OCP refill. Last PE and Pap Smear here was 06/27/2019 (NIL.) Declines all STD screening today. Tawny Hopping, RN

## 2019-11-18 NOTE — Progress Notes (Signed)
Remaining 8 packs of OTC's given per 06/27/2019 order of J. Staples, PA-C. Instructed patient to call when she begins last pack of pills for RP and pill refill. Condoms declined. Tawny Hopping, RN

## 2020-01-16 ENCOUNTER — Other Ambulatory Visit: Payer: Self-pay

## 2020-01-16 ENCOUNTER — Ambulatory Visit
Admission: RE | Admit: 2020-01-16 | Discharge: 2020-01-16 | Disposition: A | Discharge status: Home / Self Care | Payer: Self-pay | Source: Ambulatory Visit

## 2020-01-16 VITALS — BP 134/85 | HR 89 | Temp 98.3°F | Resp 18 | Ht 61.0 in | Wt 210.1 lb

## 2020-01-16 DIAGNOSIS — H60333 Swimmer's ear, bilateral: Secondary | ICD-10-CM

## 2020-01-16 MED ORDER — OFLOXACIN 0.3 % OT SOLN: 10.0000 [drp] | Freq: Every day | OTIC | 1 refills | Status: AC

## 2020-01-16 NOTE — ED Triage Notes (Signed)
Pt c/o bilateral ear pain, left worse than right. Started about 2 days ago. She states she was swimming a lot on vacation last week. Denies fever.

## 2020-01-16 NOTE — ED Provider Notes (Signed)
[facility]    CSN: [id number] Arrival date & time: 01/16/20  1512      History   Chief Complaint Chief Complaint  Patient presents with   Appointment   Otalgia    bilateral, left worse than right    HPI Eileen Peterson is a 24 y.o. female.   24 y/o female presents for bilateral ear pain x 2 days. Admits to tenderness of both outer ears. Pain is moderate, improved with Motin. Admits to increased pain with bending forward. Denies fever, fatigue, body aches. States she has mild runny nose, no sore throat or cough. Denies COVID exposure and she says she has not received a COVID vaccine. She says she tried OTC ear drying drops which made it worse. Hearing decreased. No drainage from ears. Pain in ears when talking. She says she was out of town and did a lot of swimming over the weekend. No other concerns.      Past Medical History:  Diagnosis Date   Anemia    Heart murmur    History of blood transfusion 2016   low iron   Menorrhagia 06/22/2015    Patient Active Problem List   Diagnosis Date Noted   Asthma 06/22/2015   Obesity, unspecified 06/22/2015    History reviewed. No pertinent surgical history.  OB History    Gravida  0   Para  0   Term  0   Preterm  0   AB  0   Living  0     SAB  0   TAB  0   Ectopic  0   Multiple  0   Live Births               Home Medications    Prior to Admission medications   Medication Sig Start Date End Date Taking? Authorizing Provider  Norgestimate-Ethinyl Estradiol Triphasic (ORTHO TRI-CYCLEN, 28,) 0.18/0.215/0.25 MG-35 MCG tablet Take 1 tablet by mouth daily. 06/27/19  Yes Staples, Eileen Stanford L, PA-C  Multiple Vitamins-Minerals (MULTIVITAMIN WITH MINERALS) tablet Take 1 tablet by mouth daily. 12/22/18   Matt Holmes, PA  norethindrone (MICRONOR) 0.35 MG tablet Take 1 tablet (0.35 mg total) by mouth daily. 11/18/19   Staples, Eileen Stanford L, PA-C  ofloxacin (FLOXIN) 0.3 % OTIC solution Place 10  drops into both ears daily for 7 days. 01/16/20 01/23/20  Shirlee Latch, PA-C    Family History Family History  Problem Relation Age of Onset   Hypertension Mother    Bleeding Disorder Mother    Migraines Mother    Gout Father    Anemia Sister    Migraines Sister    Diabetes Brother    Leukemia Maternal Grandmother    Lung cancer Maternal Grandfather     Social History Social History   Tobacco Use   Smoking status: Former Smoker   Smokeless tobacco: Never Used  Building services engineer Use: Never used  Substance Use Topics   Alcohol use: Yes    Comment: 2x/month   Drug use: No     Allergies   Patient has no known allergies.   Review of Systems Review of Systems  Constitutional: Negative for chills, diaphoresis, fatigue and fever.  HENT: Positive for ear pain and rhinorrhea. Negative for congestion, ear discharge, hearing loss, sinus pressure, sinus pain and sore throat.   Respiratory: Negative for cough and shortness of breath.   Gastrointestinal: Negative for abdominal pain, nausea and vomiting.  Musculoskeletal: Negative for arthralgias  and myalgias.  Skin: Negative for rash.  Neurological: Negative for dizziness, weakness and headaches.  Hematological: Negative for adenopathy.     Physical Exam Triage Vital Signs ED Triage Vitals  Enc Vitals Group     BP 01/16/20 1601 134/85     Pulse Rate 01/16/20 1601 89     Resp 01/16/20 1601 18     Temp 01/16/20 1601 98.3 F (36.8 C)     Temp Source 01/16/20 1601 Oral     SpO2 01/16/20 1601 99 %     Weight 01/16/20 1559 210 lb 1.6 oz (95.3 kg)     Height 01/16/20 1559 5\' 1"  (1.549 m)     Head Circumference --      Peak Flow --      Pain Score 01/16/20 1558 8     Pain Loc --      Pain Edu? --      Excl. in GC? --    No data found.  Updated Vital Signs BP 134/85 (BP Location: Right Arm)    Pulse 89    Temp 98.3 F (36.8 C) (Oral)    Resp 18    Ht 5\' 1"  (1.549 m)    Wt 210 lb 1.6 oz (95.3 kg)     LMP 01/11/2020    SpO2 99%    BMI 39.70 kg/m   Visual Acuity Right Eye Distance:   Left Eye Distance:   Bilateral Distance:    Right Eye Near:   Left Eye Near:    Bilateral Near:     Physical Exam Vitals and nursing note reviewed.  Constitutional:      General: She is not in acute distress.    Appearance: Normal appearance. She is not ill-appearing or toxic-appearing.  HENT:     Head: Normocephalic and atraumatic.     Right Ear: Hearing and tympanic membrane normal. Drainage, swelling and tenderness present. No mastoid tenderness.     Left Ear: Hearing and tympanic membrane normal. Swelling and tenderness present. No drainage. No mastoid tenderness.     Nose: Nose normal.     Mouth/Throat:     Mouth: Mucous membranes are moist.     Pharynx: Oropharynx is clear.  Eyes:     General: No scleral icterus.       Right eye: No discharge.        Left eye: No discharge.     Conjunctiva/sclera: Conjunctivae normal.  Cardiovascular:     Rate and Rhythm: Normal rate and regular rhythm.     Heart sounds: Normal heart sounds.  Pulmonary:     Effort: Pulmonary effort is normal. No respiratory distress.     Breath sounds: Normal breath sounds.  Musculoskeletal:     Cervical back: Neck supple.  Skin:    General: Skin is dry.  Neurological:     General: No focal deficit present.     Mental Status: She is alert. Mental status is at baseline.     Motor: No weakness.     Gait: Gait normal.  Psychiatric:        Mood and Affect: Mood normal.        Behavior: Behavior normal.        Thought Content: Thought content normal.      UC Treatments / Results  Labs (all labs ordered are listed, but only abnormal results are displayed) Labs Reviewed - No data to display  EKG   Radiology No results found.  Procedures Procedures (including critical  care time)  Medications Ordered in UC Medications - No data to display  Initial Impression / Assessment and Plan / UC Course  I have  reviewed the triage vital signs and the nursing notes.  Pertinent labs & imaging results that were available during my care of the patient were reviewed by me and considered in my medical decision making (see chart for details).    Exam consistent with bilateral otitis externa. Discussed how to use use ear drops. Advised not getting ears wet until this resolves. Warm compresses, Tylenol and Motrin for pain relief. F/u as needed for new/worsening symptoms.    Final Clinical Impressions(s) / UC Diagnoses   Final diagnoses:  Acute swimmer's ear of both sides   Discharge Instructions   None    ED Prescriptions    Medication Sig Dispense Auth. Provider   ofloxacin (FLOXIN) 0.3 % OTIC solution Place 10 drops into both ears daily for 7 days. 5 mL Shirlee Latch, PA-C     PDMP not reviewed this encounter.   Shirlee Latch, PA-C 01/18/20 1306

## 2020-04-30 ENCOUNTER — Emergency Department
Admission: EM | Admit: 2020-04-30 | Discharge: 2020-04-30 | Disposition: A | Discharge status: Home / Self Care | Payer: Self-pay | Attending: Emergency Medicine | Admitting: Emergency Medicine

## 2020-04-30 ENCOUNTER — Encounter: Payer: Self-pay | Admitting: Intensive Care

## 2020-04-30 ENCOUNTER — Other Ambulatory Visit: Payer: Self-pay

## 2020-04-30 DIAGNOSIS — H65192 Other acute nonsuppurative otitis media, left ear: Secondary | ICD-10-CM | POA: Insufficient documentation

## 2020-04-30 DIAGNOSIS — Z7951 Long term (current) use of inhaled steroids: Secondary | ICD-10-CM | POA: Insufficient documentation

## 2020-04-30 DIAGNOSIS — H65112 Acute and subacute allergic otitis media (mucoid) (sanguinous) (serous), left ear: Secondary | ICD-10-CM

## 2020-04-30 DIAGNOSIS — J069 Acute upper respiratory infection, unspecified: Secondary | ICD-10-CM | POA: Insufficient documentation

## 2020-04-30 DIAGNOSIS — Z87891 Personal history of nicotine dependence: Secondary | ICD-10-CM | POA: Insufficient documentation

## 2020-04-30 DIAGNOSIS — J45909 Unspecified asthma, uncomplicated: Secondary | ICD-10-CM | POA: Insufficient documentation

## 2020-04-30 MED ORDER — FLUTICASONE PROPIONATE 50 MCG/ACT NA SUSP: 1.0000 | Freq: Two times a day (BID) | NASAL | 0 refills | Status: DC

## 2020-04-30 MED ORDER — PSEUDOEPH-BROMPHEN-DM 30-2-10 MG/5ML PO SYRP: 10.0000 mL | ORAL_SOLUTION | Freq: Four times a day (QID) | ORAL | 0 refills | Status: AC | PRN

## 2020-04-30 MED ORDER — AMOXICILLIN 500 MG PO CAPS
1000.0000 mg | ORAL_CAPSULE | Freq: Once | ORAL | Status: AC
  Administered 2020-04-30: 1000 mg via ORAL
  Filled 2020-04-30: qty 2

## 2020-04-30 MED ORDER — AMOXICILLIN 875 MG PO TABS: 875.0000 mg | ORAL_TABLET | Freq: Two times a day (BID) | ORAL | 0 refills | Status: DC

## 2020-04-30 NOTE — ED Provider Notes (Signed)
[facility] Emergency Department Provider Note  ____________________________________________  Time seen: Approximately 5:53 PM  I have reviewed the triage vital signs and the nursing notes.   HISTORY  Chief Complaint Otalgia    HPI Eileen Peterson is a 24 y.o. female who presents emergency department complaining of left ear pain.  Patient states that she has had some cold-like symptoms with some nasal congestion, cough for the past couple of days.  She has been managing her symptoms with over-the-counter cough and cold medicine.  Patient states that she has had increased pressure to the left ear, ringing in the left ear and is concerned that she may have an ear infection.  She denies any loss of hearing.  No tenderness to the external ear.  Patient denies any fevers or chills, shortness of breath, dental pain, nausea vomiting, diarrhea constipation.         Past Medical History:  Diagnosis Date  . Anemia   . Heart murmur   . History of blood transfusion 2016   low iron  . Menorrhagia 06/22/2015    Patient Active Problem List   Diagnosis Date Noted  . Asthma 06/22/2015  . Obesity, unspecified 06/22/2015    History reviewed. No pertinent surgical history.  Prior to Admission medications   Medication Sig Start Date End Date Taking? Authorizing Provider  amoxicillin (AMOXIL) 875 MG tablet Take 1 tablet (875 mg total) by mouth 2 (two) times daily. 04/30/20   Janel Beane, Delorise Royals, PA-C  brompheniramine-pseudoephedrine-DM 30-2-10 MG/5ML syrup Take 10 mLs by mouth 4 (four) times daily as needed. 04/30/20   Krisandra Bueno, Delorise Royals, PA-C  fluticasone (FLONASE) 50 MCG/ACT nasal spray Place 1 spray into both nostrils 2 (two) times daily. 04/30/20   Jackilyn Umphlett, Delorise Royals, PA-C  Multiple Vitamins-Minerals (MULTIVITAMIN WITH MINERALS) tablet Take 1 tablet by mouth daily. 12/22/18   Matt Holmes, PA  norethindrone (MICRONOR) 0.35 MG tablet Take 1 tablet (0.35 mg  total) by mouth daily. 11/18/19   Staples, Hulda Humphrey, PA-C  Norgestimate-Ethinyl Estradiol Triphasic (ORTHO TRI-CYCLEN, 28,) 0.18/0.215/0.25 MG-35 MCG tablet Take 1 tablet by mouth daily. 06/27/19   Staples, Hulda Humphrey, PA-C    Allergies Patient has no known allergies.  Family History  Problem Relation Age of Onset  . Hypertension Mother   . Bleeding Disorder Mother   . Migraines Mother   . Gout Father   . Anemia Sister   . Migraines Sister   . Diabetes Brother   . Leukemia Maternal Grandmother   . Lung cancer Maternal Grandfather     Social History Social History   Tobacco Use  . Smoking status: Former Games developer  . Smokeless tobacco: Never Used  Vaping Use  . Vaping Use: Never used  Substance Use Topics  . Alcohol use: Yes    Alcohol/week: 4.0 standard drinks    Types: 4 Cans of beer per week  . Drug use: No     Review of Systems  Constitutional: No fever/chills Eyes: No visual changes. No discharge ENT: Positive for nasal congestion and left ear pain Cardiovascular: no chest pain. Respiratory: Positive cough. No SOB. Gastrointestinal: No abdominal pain.  No nausea, no vomiting.  No diarrhea.  No constipation. Musculoskeletal: Negative for musculoskeletal pain. Skin: Negative for rash, abrasions, lacerations, ecchymosis. Neurological: Negative for headaches, focal weakness or numbness.  10 System ROS otherwise negative.  ____________________________________________   PHYSICAL EXAM:  VITAL SIGNS: ED Triage Vitals  Enc Vitals Group     BP 04/30/20 1633 Marland Kitchen)  145/76     Pulse Rate 04/30/20 1633 93     Resp 04/30/20 1633 17     Temp 04/30/20 1633 (!) 97.5 F (36.4 C)     Temp Source 04/30/20 1633 Oral     SpO2 04/30/20 1633 100 %     Weight 04/30/20 1647 210 lb (95.3 kg)     Height 04/30/20 1647 5' (1.524 m)     Head Circumference --      Peak Flow --      Pain Score 04/30/20 1647 8     Pain Loc --      Pain Edu? --      Excl. in GC? --      Constitutional:  Alert and oriented. Well appearing and in no acute distress. Eyes: Conjunctivae are normal. PERRL. EOMI. Head: Atraumatic. ENT:      Ears: EACs unremarkable bilaterally.  TM is mildly erythematous and mildly bulging on the right side.  Injected to the left TM with significant bulging.  No tenderness over the tragus bilaterally.      Nose: No congestion/rhinnorhea.      Mouth/Throat: Mucous membranes are moist.  Neck: No stridor.  Neck is supple [place] motion Hematological/Lymphatic/Immunilogical: No cervical lymphadenopathy. Cardiovascular: Normal rate, regular rhythm. Normal S1 and S2.  Good peripheral circulation. Respiratory: Normal respiratory effort without tachypnea or retractions. Lungs CTAB. Good air entry to the bases with no decreased or absent breath sounds. Musculoskeletal: Full range of motion to all extremities. No gross deformities appreciated. Neurologic:  Normal speech and language. No gross focal neurologic deficits are appreciated.  Skin:  Skin is warm, dry and intact. No rash noted. Psychiatric: Mood and affect are normal. Speech and behavior are normal. Patient exhibits appropriate insight and judgement.   ____________________________________________   LABS (all labs ordered are listed, but only abnormal results are displayed)  Labs Reviewed - No data to display ____________________________________________  EKG   ____________________________________________  RADIOLOGY   No results found.  ____________________________________________    PROCEDURES  Procedure(s) performed:    Procedures    Medications  amoxicillin (AMOXIL) capsule 1,000 mg (has no administration in time range)     ____________________________________________   INITIAL IMPRESSION / ASSESSMENT AND PLAN / ED COURSE  Pertinent labs & imaging results that were available during my care of the patient were reviewed by me and considered in my medical decision making (see chart for  details).  Review of the [place] CSRS was performed in accordance of the [facility] prior to dispensing any controlled drugs.           Patient's diagnosis is consistent with otitis media to the left ear, viral URI.  Patient presented to emergency department with several days of cold like symptoms.  Patient developed left ear pain and tinnitus to the left ear.  Visualization revealed findings consistent with otitis media.  Patient had other signs and symptoms and physical exam findings consistent with viral URI.  Patient will have amoxicillin, Flonase, Bromfed cough syrup for symptom control.  Follow-up primary care as needed.  No indication for labs or imaging at this time..  Patient is given ED precautions to return to the ED for any worsening or new symptoms.     ____________________________________________  FINAL CLINICAL IMPRESSION(S) / ED DIAGNOSES  Final diagnoses:  Acute mucoid otitis media of left ear  Viral upper respiratory tract infection      NEW MEDICATIONS STARTED DURING THIS VISIT:  ED Discharge Orders  Ordered    amoxicillin (AMOXIL) 875 MG tablet  2 times daily        04/30/20 1753    fluticasone (FLONASE) 50 MCG/ACT nasal spray  2 times daily        04/30/20 1753    brompheniramine-pseudoephedrine-DM 30-2-10 MG/5ML syrup  4 times daily PRN        04/30/20 1753              This chart was dictated using voice recognition software/Dragon. Despite best efforts to proofread, errors can occur which can change the meaning. Any change was purely unintentional.    Racheal Patches, PA-C 04/30/20 1756    Phineas Semen, MD 04/30/20 [phone]

## 2020-04-30 NOTE — ED Triage Notes (Signed)
Pt c/o left ear pain/ringing and yellow pus drainage.

## 2020-06-26 ENCOUNTER — Other Ambulatory Visit: Payer: Self-pay

## 2020-06-26 ENCOUNTER — Encounter: Payer: Self-pay | Admitting: Physician Assistant

## 2020-06-26 ENCOUNTER — Ambulatory Visit (LOCAL_COMMUNITY_HEALTH_CENTER): Payer: Self-pay | Admitting: Physician Assistant

## 2020-06-26 VITALS — BP 126/79 | Ht 61.0 in | Wt 213.0 lb

## 2020-06-26 DIAGNOSIS — Z3009 Encounter for other general counseling and advice on contraception: Secondary | ICD-10-CM

## 2020-06-26 DIAGNOSIS — Z3041 Encounter for surveillance of contraceptive pills: Secondary | ICD-10-CM

## 2020-06-26 DIAGNOSIS — Z0000 Encounter for general adult medical examination without abnormal findings: Secondary | ICD-10-CM

## 2020-06-26 DIAGNOSIS — Z113 Encounter for screening for infections with a predominantly sexual mode of transmission: Secondary | ICD-10-CM

## 2020-06-26 MED ORDER — NORGESTIM-ETH ESTRAD TRIPHASIC 0.18/0.215/0.25 MG-35 MCG PO TABS: 1.0000 | ORAL_TABLET | Freq: Every day | ORAL | 13 refills | Status: DC

## 2020-06-26 NOTE — Progress Notes (Unsigned)
Here for well check and refill on pills. Would like HIV/RPR testing Richmond Campbell, RN

## 2020-06-27 ENCOUNTER — Encounter: Payer: Self-pay | Admitting: Physician Assistant

## 2020-06-27 NOTE — Progress Notes (Signed)
Family Planning Visit- Repeat Yearly Visit  Subjective:  Eileen Peterson is a 25 y.o. G0P0000  being seen today for an well woman visit and to discuss family planning options.    She is currently using Combination OCPs for pregnancy prevention. Patient reports she does not want a pregnancy in the next year. Patient  has Asthma and Obesity, unspecified on their problem list.  Chief Complaint  Patient presents with  . Contraception    PE and continue with OCs    Patient reports that she is doing well with her current OC and desires to continue with this as her BCM.  States that she has been out of OCs for about 1 week.  Reports bruising easily but that it has happened her whole life and is due to her history of anemia.  Reports that she has headaches off and on, there has been no change and they are related to stress and relieved by Tylenol.  Patient states she has gained some weight since she got a new job where she is sitting at a desk all day.   Per chart review, pap is due in 2024 and CBE is due to day.   Patient denies any other concerns.   See flowsheet for other program required questions.   Body mass index is 40.25 kg/m. - Patient is eligible for diabetes screening based on BMI and age >70?  not applicable HA1C ordered? not applicable  Patient reports 1  partner in last year. Desires STI screening?  Yes   Has patient been screened once for HCV in the past?  No  No results found for: HCVAB  Does the patient have current of drug use, have a partner with drug use, and/or has been incarcerated since last result? No  If yes-- Screen for HCV through [facility]   Does the patient meet criteria for HBV testing? No  Criteria:  -Household, sexual or needle sharing contact with HBV -History of drug use -HIV positive -Those with known Hep C   Health Maintenance Due  Topic Date Due  . Hepatitis C Screening  Never done  . COVID-19 Vaccine (1) Never done  . TETANUS/TDAP  Never  done  . INFLUENZA VACCINE  Never done    ROS  The following portions of the patient's history were reviewed and updated as appropriate: allergies, current medications, past family history, past medical history, past social history, past surgical history and problem list. Problem list updated.  Objective:   Vitals:   06/26/20 0906  BP: 126/79  Weight: 213 lb (96.6 kg)  Height: 5\' 1"  (1.549 m)    Physical Exam Vitals and nursing note reviewed.  Constitutional:      General: She is not in acute distress.    Appearance: Normal appearance.  HENT:     Head: Normocephalic and atraumatic.     Mouth/Throat:     Mouth: Mucous membranes are moist.     Pharynx: Oropharynx is clear. No oropharyngeal exudate or posterior oropharyngeal erythema.  Eyes:     Conjunctiva/sclera: Conjunctivae normal.  Neck:     Thyroid: No thyroid mass, thyromegaly or thyroid tenderness.  Cardiovascular:     Rate and Rhythm: Normal rate and regular rhythm.  Pulmonary:     Effort: Pulmonary effort is normal.     Breath sounds: Normal breath sounds.  Chest:  Breasts:     Right: Normal. No mass, nipple discharge, skin change, tenderness, axillary adenopathy or supraclavicular adenopathy.  Left: No mass, nipple discharge, skin change, tenderness, axillary adenopathy or supraclavicular adenopathy.    Abdominal:     Palpations: Abdomen is soft. There is no mass.     Tenderness: There is no abdominal tenderness. There is no guarding or rebound.  Musculoskeletal:     Cervical back: Neck supple. No tenderness.  Lymphadenopathy:     Cervical: No cervical adenopathy.     Upper Body:     Right upper body: No supraclavicular, axillary or pectoral adenopathy.     Left upper body: No supraclavicular, axillary or pectoral adenopathy.  Skin:    General: Skin is warm and dry.     Findings: No bruising, erythema, lesion or rash.  Neurological:     Mental Status: She is alert and oriented to person, place, and  time.  Psychiatric:        Mood and Affect: Mood normal.        Behavior: Behavior normal.        Thought Content: Thought content normal.        Judgment: Judgment normal.       Assessment and Plan:  Eileen Peterson is a 25 y.o. female G0P0000 presenting to the [facility] Department for an yearly well woman exam/family planning visit  Contraception counseling: Reviewed all forms of birth control options in the tiered based approach. available including abstinence; over the counter/barrier methods; hormonal contraceptive medication including pill, patch, ring, injection,contraceptive implant, ECP; hormonal and nonhormonal IUDs; permanent sterilization options including vasectomy and the various tubal sterilization modalities. Risks, benefits, and typical effectiveness rates were reviewed.  Questions were answered.  Written information was also given to the patient to review.  Patient desires to continue with her current OC, this was prescribed for patient. She will follow up in  10 months and prn for surveillance.  She was told to call with any further questions, or with any concerns about this method of contraception.  Emphasized use of condoms 100% of the time for STI prevention.  Patient was not a candidate for ECP today.    1. Encounter for counseling regarding contraception Reviewed with patient SE of OCs and when to d/c and call clinic. Enc condoms with all sex for first cycle of OCs since will be restarting and enc always for STD prevention.  2. Screening for STD (sexually transmitted disease) Patient opted to self-collect vaginal samples. Await test results.  Counseled that RN will call if needs to RTC for treatment once results are back.  - Chlamydia/Gonorrhea [place] [facility] - HIV/HCV [place] [facility] - Syphilis Serology, [place] [facility]  3. Well woman exam (no gynecological exam) Reviewed with patient healthy habits to maintain general health. Discussed with patient  taking opportunities to do 10-15 min of exercise at work during breaks as a way to help with weight loss. Enc well balanced diet and drinking plenty of water.  Enc MVI 1 po daily. Enc to establish with/ follow up with PCP for primary care concerns, age appropriate screenings and illness.  4. Surveillance of previously prescribed contraceptive pill Will give Tri Sprintec 28 d #10 1 po daily at the same time each day.   Only able to give #10 due to supply and exp date. Counseled patient to call when she opens last pack of OCs to make an appointment to pick up rest of supply. - Norgestimate-Ethinyl Estradiol Triphasic (TRI-SPRINTEC) 0.18/0.215/0.25 MG-35 MCG tablet; Take 1 tablet by mouth daily.  Dispense: 28 tablet; Refill: 13  No follow-ups on file.  No future appointments.  Jerene Dilling, PA

## 2020-07-07 NOTE — Progress Notes (Signed)
Chart reviewed by Pharmacist  Suzanne Walker PharmD, Contract Pharmacist at [facility]  

## 2021-04-11 ENCOUNTER — Other Ambulatory Visit: Payer: Self-pay

## 2021-04-11 ENCOUNTER — Ambulatory Visit (LOCAL_COMMUNITY_HEALTH_CENTER): Payer: Self-pay | Admitting: Physician Assistant

## 2021-04-11 VITALS — BP 134/76 | Ht 61.0 in | Wt 206.0 lb

## 2021-04-11 DIAGNOSIS — Z3041 Encounter for surveillance of contraceptive pills: Secondary | ICD-10-CM

## 2021-04-11 DIAGNOSIS — Z3009 Encounter for other general counseling and advice on contraception: Secondary | ICD-10-CM

## 2021-04-11 MED ORDER — NORGESTIM-ETH ESTRAD TRIPHASIC 0.18/0.215/0.25 MG-35 MCG PO TABS: 1.0000 | ORAL_TABLET | Freq: Every day | ORAL | 3 refills | Status: AC

## 2021-04-11 NOTE — Progress Notes (Signed)
   [facility]  Subjective:  Eileen Peterson is a 25 y.o. being seen today for pill supply.  Chief Complaint  Patient presents with   Contraception    Pill pick up    HPI Patient states that she is here to pick up the last of her OCP to last until her RP is due in January.  Denies problems with current OCP, Tri Sprintec and desires to continue with them.  Does the patient have a current or past history of drug use? No   No components found for: HCV]   Health Maintenance Due  Topic Date Due   COVID-19 Vaccine (1) Never done   Pneumococcal Vaccine 37-61 Years old (1 - PCV) Never done   HPV VACCINES (1 - 2-dose series) Never done   Hepatitis C Screening  Never done   TETANUS/TDAP  Never done   INFLUENZA VACCINE  Never done    Review of Systems  All other systems reviewed and are negative.  The following portions of the patient's history were reviewed and updated as appropriate: allergies, current medications, past family history, past medical history, past social history, past surgical history and problem list. Problem list updated.   See flowsheet for other program required questions.  Objective:   Vitals:   04/11/21 0824  BP: 134/76  Weight: 206 lb (93.4 kg)  Height: 5\' 1"  (1.549 m)    Physical Exam Constitutional:      General: She is not in acute distress.    Appearance: Normal appearance.  HENT:     Head: Normocephalic and atraumatic.  Eyes:     Conjunctiva/sclera: Conjunctivae normal.  Pulmonary:     Effort: Pulmonary effort is normal.  Skin:    General: Skin is warm and dry.  Neurological:     Mental Status: She is alert and oriented to person, place, and time.  Psychiatric:        Mood and Affect: Mood normal.        Behavior: Behavior normal.        Thought Content: Thought content normal.        Judgment: Judgment normal.      Assessment and Plan:  Eileen Peterson is a 25  y.o. female presenting to the [facility] Department for a Women's Health problem visit  1. Encounter for counseling regarding contraception Reviewed with patient normal SE of OCP and when to call clinic with concerns. Rec condoms with all sex.  2. Surveillance of previously prescribed contraceptive pill Patient given #10 packs of Tri Sprintec at last RP due to exp date of OCP Patient given 3 packs of Norgestimate-Ethinyl Estradial Triphasic to complete her year supply. Enc patient to call prior to running out of OCP for RP appointment. - Norgestimate-Ethinyl Estradiol Triphasic 0.18/0.215/0.25 MG-35 MCG tablet; Take 1 tablet by mouth daily.  Dispense: 28 tablet; Refill: 3     Return in about 3 months (around 07/12/2021) for RP.  No future appointments.  09/09/2021, PA

## 2021-04-14 NOTE — Progress Notes (Signed)
Chart reviewed by Pharmacist  Suzanne Walker PharmD, [facility]  

## 2021-07-03 ENCOUNTER — Other Ambulatory Visit: Payer: Self-pay

## 2021-07-03 ENCOUNTER — Encounter: Payer: Self-pay | Admitting: Family Medicine

## 2021-07-03 ENCOUNTER — Ambulatory Visit (LOCAL_COMMUNITY_HEALTH_CENTER): Payer: Self-pay | Admitting: Family Medicine

## 2021-07-03 VITALS — BP 131/82 | Ht 61.0 in | Wt 207.4 lb

## 2021-07-03 DIAGNOSIS — Z6839 Body mass index (BMI) 39.0-39.9, adult: Secondary | ICD-10-CM

## 2021-07-03 DIAGNOSIS — Z30011 Encounter for initial prescription of contraceptive pills: Secondary | ICD-10-CM

## 2021-07-03 DIAGNOSIS — Z3009 Encounter for other general counseling and advice on contraception: Secondary | ICD-10-CM

## 2021-07-03 DIAGNOSIS — Z113 Encounter for screening for infections with a predominantly sexual mode of transmission: Secondary | ICD-10-CM

## 2021-07-03 LAB — WET PREP FOR TRICH, YEAST, CLUE
Trichomonas Exam: NEGATIVE
Yeast Exam: NEGATIVE

## 2021-07-03 MED ORDER — NORGESTIM-ETH ESTRAD TRIPHASIC 0.18/0.215/0.25 MG-25 MCG PO TABS: 1.0000 | ORAL_TABLET | Freq: Every day | ORAL | 12 refills | Status: AC

## 2021-07-03 NOTE — Progress Notes (Signed)
Patient here for PE, OCP and STD check. Wet prep reviewed, no tx per standing orders. PCP list given to patient. 9 packs of Tri Sprintec lot [id number]. Exp date 05/01/2022. Patient instructed to call to pick up the remainder of her BC packs.

## 2021-07-03 NOTE — Progress Notes (Signed)
[facility] DEPARTMENT [facility] [address] Main Number: [phone]  Family Planning Visit- Repeat Yearly Visit  Subjective:  Eileen Peterson is a 26 y.o. G0P0000  being seen today for an annual wellness visit and to discuss contraception options.   The patient is currently using Oral Contraceptive for pregnancy prevention. Patient does not want a pregnancy in the next year. Patient has the following medical problems: has Asthma and Obesity, unspecified on their problem list.  Chief Complaint  Patient presents with   Annual Exam    Patient reports she is here for her annual exam and birth control  Eileen Peterson is taking generic Tri Sprintec and would like to continue with this method.  Eileen Peterson requests and STD screen  Patient denies any concerns or problems  See flowsheet for other program required questions.   Body mass index is 39.19 kg/m. - Patient is eligible for diabetes screening based on BMI and age >1?  not applicable HA1C ordered? not applicable  Patient reports 1 of partners in last year. Desires STI screening?  Yes   Has patient been screened once for HCV in the past?  No  No results found for: HCVAB  Does the patient have current of drug use, have a partner with drug use, and/or has been incarcerated since last result? no  If yes-- Screen for HCV through [facility]   Does the patient meet criteria for HBV testing? No  Criteria:  -Household, sexual or needle sharing contact with HBV -History of drug use -HIV positive -Those with known Hep C   Health Maintenance Due  Topic Date Due   COVID-19 Vaccine (1) Never done   HPV VACCINES (1 - 2-dose series) Never done   Hepatitis C Screening  Never done   TETANUS/TDAP  Never done   INFLUENZA VACCINE  Never done    Review of Systems  Constitutional:  Positive for weight loss.       Weight gain  Neurological:  Positive for focal weakness and headaches.       Migraine hx.   All other systems reviewed and are negative.  The following portions of the patient's history were reviewed and updated as appropriate: allergies, current medications, past family history, past medical history, past social history, past surgical history and problem list. Problem list updated.  Objective:   Vitals:   07/03/21 0817  BP: 131/82  Weight: 207 lb 6.4 oz (94.1 kg)    Physical Exam Constitutional:      Appearance: Normal appearance.  HENT:     Head: Normocephalic and atraumatic.  Pulmonary:     Effort: Pulmonary effort is normal.  Abdominal:     Palpations: Abdomen is soft.  Genitourinary:    Comments: Eileen Peterson deferred pelvic exam- self collect wet prep and NAAT Musculoskeletal:        General: Normal range of motion.  Skin:    General: Skin is warm and dry.  Neurological:     General: No focal deficit present.     Mental Status: She is alert.  Psychiatric:        Mood and Affect: Mood normal.        Behavior: Behavior normal.      Assessment and Plan:  EDDY TERMINE is a 26 y.o. female G0P0000 presenting to the [facility] Department for an yearly wellness and contraception visit  Contraception counseling: Reviewed all forms of birth control options in the tiered based approach. available  including abstinence; over the counter/barrier methods; hormonal contraceptive medication including pill, patch, ring, injection,contraceptive implant, ECP; hormonal and nonhormonal IUDs; permanent sterilization options including vasectomy and the various tubal sterilization modalities. Risks, benefits, and typical effectiveness rates were reviewed.  Questions were answered.  Written information was also given to the patient to review.  Patient desires Tri Sprintec, this was prescribed for patient. She will follow up in 1 year for surveillance.    was told to call with any further questions, or with any concerns about this method of contraception.  Emphasized use of  condoms 100% of the time for STI prevention.  Patient was not offered ECP based on continuous use of OCPs.    1. Family planning services Pap due 2024 - Norgestimate-Ethinyl Estradiol Triphasic 0.18/0.215/0.25 MG-25 MCG tab; Take 1 tablet by mouth daily.  Dispense: 28 tablet; Refill: 12  2. OCP (oral contraceptive pills) initiation  - Norgestimate-Ethinyl Estradiol Triphasic 0.18/0.215/0.25 MG-25 MCG tab; Take 1 tablet by mouth daily.  Dispense: 28 tablet; Refill: 12  3. Screening examination for venereal disease  - WET PREP FOR TRICH, YEAST, CLUE - Chlamydia/Gonorrhea [place] [facility] - HIV [place] [facility] - Syphilis Serology, [place] [facility]  4. BMI 39.0-39.9, adult   Co. Eileen Peterson on normal weight and BMI.  Encouraged weight loss and daily exercise.  Please give weight management information.     F/U 9 months for pill pickup I year for annual exam with pap  Larene Pickett, FNP

## 2021-12-06 ENCOUNTER — Telehealth: Payer: Self-pay | Admitting: Family Medicine

## 2021-12-06 NOTE — Telephone Encounter (Signed)
Pt states that she was only given enough birth control on 07/03/21 to last until this month. She states that she was told that the birth control she wanted was all expired and that she needed to come back to get the one she wanted. It appears from the notes that she was given a prescription with a 12 month refill at the 07/03/21 appointment.

## 2021-12-06 NOTE — Telephone Encounter (Signed)
Spoke with patient and informed her that she was given 9 boxes of pills that should've lasted until November.  Pt states that she moved and doesn't know if she can find them.  Pt will attempt to located them and if not, we will call her Rx into the Pharmacy of her choice.

## 2021-12-24 ENCOUNTER — Ambulatory Visit (LOCAL_COMMUNITY_HEALTH_CENTER): Payer: Self-pay

## 2021-12-24 VITALS — BP 130/85 | Ht 61.0 in | Wt 204.5 lb

## 2021-12-24 DIAGNOSIS — Z3041 Encounter for surveillance of contraceptive pills: Secondary | ICD-10-CM

## 2021-12-24 DIAGNOSIS — Z3009 Encounter for other general counseling and advice on contraception: Secondary | ICD-10-CM

## 2021-12-24 MED ORDER — NORGESTIM-ETH ESTRAD TRIPHASIC 0.18/0.215/0.25 MG-35 MCG PO TABS: 1.0000 | ORAL_TABLET | Freq: Every day | ORAL | 0 refills | Status: AC

## 2021-12-24 NOTE — Progress Notes (Addendum)
In [facility] for ocp refill (Tri Sprintec). Pt explains she moved recently and lost ocp.  Took last ocp 12/19/2021 and started menses that same day. Before running out of ocp, admits to taking ocp daily and not missing any pills. Reports last sex first week of July 2023.  Pt was given #9 packs Tri Sprintec at annual visit 07/03/2021.   Consult with E Sciora, CNM who gives ok for pt to restart ocp and dispense ocp to complete order by Barrington Ellison, FNP. Advises RN to counsel pt regarding impt and responsibility of keeping up with ocp.   RN carried out provider orders.   The patient was dispensed Tri Sprintec #4 packs today to complete order by Barrington Ellison, FNP on 2/1 /2023. I provided counseling today regarding the medication. We discussed the medication, the side effects and when to call clinic. Patient given the opportunity to ask questions. Questions answered. Pt advised to contact [facility] when starts last pack for ocp refill. Ocp pack labeled with this message. Jerel Shepherd, RN Consulted on the plan of care for this client.  I agree with the documented note and actions taken to provide care for this client.  Hazle Coca, CNM

## 2022-05-07 ENCOUNTER — Emergency Department
Admission: EM | Admit: 2022-05-07 | Discharge: 2022-05-07 | Discharge status: Against Medical Advice | Payer: Self-pay | Attending: Emergency Medicine | Admitting: Emergency Medicine

## 2022-05-07 ENCOUNTER — Encounter (HOSPITAL_COMMUNITY): Payer: Self-pay

## 2022-05-07 ENCOUNTER — Other Ambulatory Visit: Payer: Self-pay

## 2022-05-07 ENCOUNTER — Emergency Department: Payer: Self-pay

## 2022-05-07 ENCOUNTER — Telehealth: Payer: Self-pay | Admitting: Medical Oncology

## 2022-05-07 ENCOUNTER — Encounter: Payer: Self-pay | Admitting: Emergency Medicine

## 2022-05-07 DIAGNOSIS — R1013 Epigastric pain: Secondary | ICD-10-CM | POA: Insufficient documentation

## 2022-05-07 DIAGNOSIS — D649 Anemia, unspecified: Secondary | ICD-10-CM | POA: Insufficient documentation

## 2022-05-07 DIAGNOSIS — R17 Unspecified jaundice: Secondary | ICD-10-CM | POA: Insufficient documentation

## 2022-05-07 DIAGNOSIS — Z5321 Procedure and treatment not carried out due to patient leaving prior to being seen by health care provider: Secondary | ICD-10-CM | POA: Insufficient documentation

## 2022-05-07 DIAGNOSIS — K8051 Calculus of bile duct without cholangitis or cholecystitis with obstruction: Secondary | ICD-10-CM | POA: Insufficient documentation

## 2022-05-07 DIAGNOSIS — R112 Nausea with vomiting, unspecified: Secondary | ICD-10-CM | POA: Insufficient documentation

## 2022-05-07 LAB — CBC WITH DIFFERENTIAL/PLATELET
Abs Immature Granulocytes: 0.02 10*3/uL (ref 0.00–0.07)
Abs Immature Granulocytes: 0.01 10*3/uL (ref 0.00–0.07)
Basophils Absolute: 0.1 10*3/uL (ref 0.0–0.1)
Basophils Absolute: 0.1 10*3/uL (ref 0.0–0.1)
Basophils Relative: 1 %
Basophils Relative: 1 %
Eosinophils Absolute: 0.2 10*3/uL (ref 0.0–0.5)
Eosinophils Absolute: 0.2 10*3/uL (ref 0.0–0.5)
Eosinophils Relative: 3 %
Eosinophils Relative: 4 %
HCT: 29.4 % — ABNORMAL LOW (ref 36.0–46.0)
HCT: 28.8 % — ABNORMAL LOW (ref 36.0–46.0)
Hemoglobin: 7.3 g/dL — ABNORMAL LOW (ref 12.0–15.0)
Hemoglobin: 7.2 g/dL — ABNORMAL LOW (ref 12.0–15.0)
Immature Granulocytes: 0 %
Immature Granulocytes: 0 %
Lymphocytes Relative: 24 %
Lymphocytes Relative: 42 %
Lymphs Abs: 1.8 10*3/uL (ref 0.7–4.0)
Lymphs Abs: 1.7 10*3/uL (ref 0.7–4.0)
MCH: 15 pg — ABNORMAL LOW (ref 26.0–34.0)
MCH: 15 pg — ABNORMAL LOW (ref 26.0–34.0)
MCHC: 24.8 g/dL — ABNORMAL LOW (ref 30.0–36.0)
MCHC: 25 g/dL — ABNORMAL LOW (ref 30.0–36.0)
MCV: 60.2 fL — ABNORMAL LOW (ref 80.0–100.0)
MCV: 60 fL — ABNORMAL LOW (ref 80.0–100.0)
Monocytes Absolute: 0.5 10*3/uL (ref 0.1–1.0)
Monocytes Absolute: 0.4 10*3/uL (ref 0.1–1.0)
Monocytes Relative: 7 %
Monocytes Relative: 9 %
Neutro Abs: 4.7 10*3/uL (ref 1.7–7.7)
Neutro Abs: 1.8 10*3/uL (ref 1.7–7.7)
Neutrophils Relative %: 65 %
Neutrophils Relative %: 44 %
Platelets: 350 10*3/uL (ref 150–400)
Platelets: 319 10*3/uL (ref 150–400)
RBC: 4.88 MIL/uL (ref 3.87–5.11)
RBC: 4.8 MIL/uL (ref 3.87–5.11)
RDW: 20.9 % — ABNORMAL HIGH (ref 11.5–15.5)
RDW: 20.7 % — ABNORMAL HIGH (ref 11.5–15.5)
Smear Review: NORMAL
Smear Review: NORMAL
WBC: 7.3 10*3/uL (ref 4.0–10.5)
WBC: 4.1 10*3/uL (ref 4.0–10.5)
nRBC: 0 % (ref 0.0–0.2)
nRBC: 0 % (ref 0.0–0.2)

## 2022-05-07 LAB — COMPREHENSIVE METABOLIC PANEL
ALT: 93 U/L — ABNORMAL HIGH (ref 0–44)
ALT: 156 U/L — ABNORMAL HIGH (ref 0–44)
AST: 165 U/L — ABNORMAL HIGH (ref 15–41)
AST: 256 U/L — ABNORMAL HIGH (ref 15–41)
Albumin: 3.8 g/dL (ref 3.5–5.0)
Albumin: 3.6 g/dL (ref 3.5–5.0)
Alkaline Phosphatase: 191 U/L — ABNORMAL HIGH (ref 38–126)
Alkaline Phosphatase: 200 U/L — ABNORMAL HIGH (ref 38–126)
Anion gap: 9 (ref 5–15)
Anion gap: 9 (ref 5–15)
BUN: 9 mg/dL (ref 6–20)
BUN: 11 mg/dL (ref 6–20)
CO2: 23 mmol/L (ref 22–32)
CO2: 23 mmol/L (ref 22–32)
Calcium: 9.1 mg/dL (ref 8.9–10.3)
Calcium: 9 mg/dL (ref 8.9–10.3)
Chloride: 107 mmol/L (ref 98–111)
Chloride: 107 mmol/L (ref 98–111)
Creatinine, Ser: 0.7 mg/dL (ref 0.44–1.00)
Creatinine, Ser: 0.54 mg/dL (ref 0.44–1.00)
GFR, Estimated: >60 mL/min (ref >60)
GFR, Estimated: >60 mL/min (ref >60)
Glucose, Bld: 121 mg/dL — ABNORMAL HIGH (ref 70–99)
Glucose, Bld: 97 mg/dL (ref 70–99)
Potassium: 3.2 mmol/L — ABNORMAL LOW (ref 3.5–5.1)
Potassium: 3.2 mmol/L — ABNORMAL LOW (ref 3.5–5.1)
Sodium: 139 mmol/L (ref 135–145)
Sodium: 139 mmol/L (ref 135–145)
Total Bilirubin: 4.2 mg/dL — ABNORMAL HIGH (ref 0.3–1.2)
Total Bilirubin: 5 mg/dL — ABNORMAL HIGH (ref 0.3–1.2)
Total Protein: 7.8 g/dL (ref 6.5–8.1)
Total Protein: 7.6 g/dL (ref 6.5–8.1)

## 2022-05-07 LAB — LIPASE, BLOOD
Lipase: 31 U/L (ref 11–51)
Lipase: 28 U/L (ref 11–51)

## 2022-05-07 LAB — URINALYSIS, ROUTINE W REFLEX MICROSCOPIC
Glucose, UA: NEGATIVE mg/dL
Ketones, ur: NEGATIVE mg/dL
Leukocytes,Ua: NEGATIVE
Nitrite: NEGATIVE
Protein, ur: 100 mg/dL — AB
Specific Gravity, Urine: 1.03 (ref 1.005–1.030)
pH: 5 (ref 5.0–8.0)

## 2022-05-07 LAB — POC URINE PREG, ED: Preg Test, Ur: NEGATIVE

## 2022-05-07 LAB — IRON AND TIBC
Iron: 22 ug/dL — ABNORMAL LOW (ref 28–170)
Saturation Ratios: 4 % — ABNORMAL LOW (ref 10.4–31.8)
TIBC: 522 ug/dL — ABNORMAL HIGH (ref 250–450)
UIBC: 500 ug/dL

## 2022-05-07 LAB — FERRITIN: Ferritin: 2 ng/mL — ABNORMAL LOW (ref 11–307)

## 2022-05-07 MED ORDER — ONDANSETRON 8 MG PO TBDP
8.0000 mg | ORAL_TABLET | Freq: Once | ORAL | Status: AC
  Administered 2022-05-07: 8 mg via ORAL
  Filled 2022-05-07: qty 1

## 2022-05-07 MED ORDER — ONDANSETRON 4 MG PO TBDP: 4.0000 mg | ORAL_TABLET | Freq: Three times a day (TID) | ORAL | 0 refills | Status: AC | PRN

## 2022-05-07 MED ORDER — KETOROLAC TROMETHAMINE 10 MG PO TABS: 10.0000 mg | ORAL_TABLET | Freq: Four times a day (QID) | ORAL | 0 refills | Status: AC | PRN

## 2022-05-07 MED ORDER — OXYCODONE-ACETAMINOPHEN 5-325 MG PO TABS: 1.0000 | ORAL_TABLET | Freq: Four times a day (QID) | ORAL | 0 refills | Status: AC | PRN

## 2022-05-07 NOTE — ED Provider Notes (Addendum)
[facility] Provider Note    Event Date/Time   First MD Initiated Contact with Patient 05/07/22 1726     (approximate)   History   Chief Complaint: Abdominal Pain (Patient presents with generalized abdominal pain that increases when she eats; LWBS yesterday, was advised to come back to ED d/t elevated AST/ALT)   HPI  Eileen Peterson is a 26 y.o. female with a past history of iron deficiency anemia who comes the ED complaining of upper abdominal pain for the past few weeks which is worse with eating, becoming more consistent and severe.  Radiates to the back.  No fever chest pain shortness of breath.  No diarrhea.  Came to the ED last night where labs and ultrasound were obtained, and then she left without being seen.     Physical Exam   Triage Vital Signs: ED Triage Vitals  Enc Vitals Group     BP 05/07/22 1426 (!) 149/84     Pulse Rate 05/07/22 1426 73     Resp 05/07/22 1426 19     Temp 05/07/22 1426 98.1 F (36.7 C)     Temp Source 05/07/22 1426 Oral     SpO2 05/07/22 1426 96 %     Weight 05/07/22 1428 195 lb 15.8 oz (88.9 kg)     Height 05/07/22 1428 5' (1.524 m)     Head Circumference --      Peak Flow --      Pain Score 05/07/22 1427 7     Pain Loc --      Pain Edu? --      Excl. in [place]? --     Most recent vital signs: Vitals:   05/07/22 1426  BP: (!) 149/84  Pulse: 73  Resp: 19  Temp: 98.1 F (36.7 C)  SpO2: 96%    General: Awake, no distress.  CV:  Good peripheral perfusion.  Resp:  Normal effort.  Abd:  No distention.  Soft with epigastric and right upper quadrant tenderness. Other:  Moist oral mucosa.  Mildly jaundiced   ED Results / Procedures / Treatments   Labs (all labs ordered are listed, but only abnormal results are displayed) Labs Reviewed  COMPREHENSIVE METABOLIC PANEL - Abnormal; Notable for the following components:      Result Value   Potassium 3.2 (*)    AST 256 (*)    ALT 156 (*)    Alkaline  Phosphatase 200 (*)    Total Bilirubin 5.0 (*)    All other components within normal limits  CBC WITH DIFFERENTIAL/PLATELET - Abnormal; Notable for the following components:   Hemoglobin 7.2 (*)    HCT 28.8 (*)    MCV 60.0 (*)    MCH 15.0 (*)    MCHC 25.0 (*)    RDW 20.7 (*)    All other components within normal limits  IRON AND TIBC - Abnormal; Notable for the following components:   Iron 22 (*)    TIBC 522 (*)    Saturation Ratios 4 (*)    All other components within normal limits  FERRITIN - Abnormal; Notable for the following components:   Ferritin 2 (*)    All other components within normal limits  LIPASE, BLOOD     EKG    RADIOLOGY Ultrasound right upper quadrant interpreted by me, negative for objective signs of cholecystitis.  Radiology report reviewed.   PROCEDURES:  Procedures   MEDICATIONS ORDERED IN ED: Medications  ondansetron (ZOFRAN-ODT) disintegrating  tablet 8 mg (8 mg Oral Given 05/07/22 1802)     IMPRESSION / MDM / ASSESSMENT AND PLAN / ED COURSE  I reviewed the triage vital signs and the nursing notes.                              Differential diagnosis includes, but is not limited to, cholecystitis, choledocholithiasis, pancreatitis, gastritis, iron deficiency anemia  Patient's presentation is most consistent with acute presentation with potential threat to life or bodily function.  Patient presents with upper abdominal pain, with worse after eating with tenderness.  Vitals are unremarkable.  No leukocytosis.  She has chronic stable microcytic anemia related to iron deficiency.  Chemistry panel shows rising LFTs and total bilirubin which have continued to increase since the lab check last night.  Lipase normal.  Ultrasound last night nondiagnostic.  Will obtain MRCP.  We will give oral Zofran for nausea.   ----------------------------------------- 10:32 PM on 05/07/2022 ----------------------------------------- MRCP positive for distal CBD  choledocholithiasis.  No signs of acute infection.  Patient's vital signs remain normal, not febrile.  No leukocytosis on labs.  Findings discussed with GI Dr. Alice Reichert who recommends patient be admitted, and transferred to [facility] for availability of ERCP capable gastroenterology.  However, patient reports that being admitted is not an option for her at this time due to work obligations.  She understands the risk for worsening severe pain, pancreatitis, infection causing sepsis, and liver dysfunction.  She will come back to the ED right away if she changes her mind or any worsening symptoms develop.  She has medical decision-making capacity and will be discharged [place].  I am prescribing medications for pain and nausea control.  The patient and/or responsible adult understands that the hospital has offered any or all of the following: o To examine the patient to determine whether or not an emergency medical condition is present. o To provide stabilizing medical treatment for an emergency condition. o To provide a medically appropriate transfer to another healthcare facility. o To administer ongoing (inpatient) medical care.  The hospital personnel and/or physician have informed the patient of the benefits that might reasonably be expected from the offered services, the risks of refusing the offered services, and the risks of the offered services.  The patient and/or responsible adult appears to have the capacity to understand the risks and benefits as stated. Alternative treatment options have also been discussed with the patient.  The patient and/or responsible adult refuses the offered services against medical advice and the refusal may result in a worsening condition including chronic disability or death.  The patient is welcome to return at any time and chooses to refuse the offered services.       FINAL CLINICAL IMPRESSION(S) / ED DIAGNOSES   Final diagnoses:  Calculus  of bile duct without cholecystitis with obstruction     Rx / DC Orders   ED Discharge Orders          Ordered    oxyCODONE-acetaminophen (PERCOCET) 5-325 MG tablet  Every 6 hours PRN        05/07/22 2230    ketorolac (TORADOL) 10 MG tablet  Every 6 hours PRN       Note to Pharmacy: Continuation of parenteral therapy   05/07/22 2230    ondansetron (ZOFRAN-ODT) 4 MG disintegrating tablet  Every 8 hours PRN        05/07/22 2230  Note:  This document was prepared using Dragon voice recognition software and may include unintentional dictation errors.   Sharman Cheek, MD 05/07/22 2234    Sharman Cheek, MD 05/07/22 2238

## 2022-05-07 NOTE — ED Notes (Signed)
No answer when called several times from lobby 

## 2022-05-07 NOTE — ED Notes (Signed)
Cancelled [facility] request for consult and transfer

## 2022-05-07 NOTE — Discharge Instructions (Signed)
Please return to the ER right away if you have severe, unrelenting pain or fever, dizziness, yellowing of the eyes, or other new concerns.

## 2022-05-07 NOTE — ED Notes (Signed)
Called [facility] for possible transfer to [facility] will page out the hospitalist

## 2022-05-07 NOTE — Telephone Encounter (Signed)
Called pt to inquire about pain, pt reports pain after eating. D/t increased ALT/AST, pt encouraged to follow up with PCP, pt reports she does not have PCP, instructed to come back to ED and may have to wait if continued pain.

## 2022-05-07 NOTE — ED Triage Notes (Signed)
Patient ambulatory to triage with steady gait, without difficulty or distress noted; pt reports since yesterday having mid epigastric pain accomp by N/V

## 2022-05-07 NOTE — ED Notes (Addendum)
No answer when called several times from lobby 

## 2022-06-05 ENCOUNTER — Telehealth: Payer: Self-pay | Admitting: Family Medicine

## 2022-06-05 NOTE — Telephone Encounter (Signed)
Pt is running low her BC, she only has pills until next Tuesday. Please call her back to set up a time for her to pick up some more. Thanks

## 2022-06-09 NOTE — Telephone Encounter (Signed)
Is this a nurse clinic situation?
